# Patient Record
Sex: Female | Born: 1990 | Race: Black or African American | Hispanic: No | Marital: Single | Smoking: Former smoker
Health system: Southern US, Community
[De-identification: ages and names within clinical notes are randomized; demographics above are authoritative.]

## PROBLEM LIST (undated history)

## (undated) HISTORY — PX: APPENDECTOMY: SHX54

## (undated) HISTORY — PX: COLON SURGERY: SHX602

---

## 2013-01-25 ENCOUNTER — Emergency Department (HOSPITAL_COMMUNITY)
Admission: EM | Admit: 2013-01-25 | Discharge: 2013-01-25 | Disposition: A | Discharge status: Home / Self Care | Payer: Self-pay | Attending: Emergency Medicine | Admitting: Emergency Medicine

## 2013-01-25 ENCOUNTER — Encounter (HOSPITAL_COMMUNITY): Payer: Self-pay | Admitting: Emergency Medicine

## 2013-01-25 DIAGNOSIS — Z0489 Encounter for examination and observation for other specified reasons: Secondary | ICD-10-CM | POA: Insufficient documentation

## 2013-01-25 DIAGNOSIS — Z0283 Encounter for blood-alcohol and blood-drug test: Secondary | ICD-10-CM

## 2013-01-25 NOTE — ED Notes (Signed)
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  Pt admitted to drinking last night.  Employer brought her here to have a BAC done because the urgent care that [facility] is affiliated with does not do drug screens/ethanol.  Pt has consented to testing.

## 2013-01-25 NOTE — ED Provider Notes (Signed)
Medical screening examination/treatment/procedure(s) were performed by non-physician practitioner and as supervising physician I was immediately available for consultation/collaboration.  Lyanne Co, MD 01/25/13 [phone]

## 2013-01-25 NOTE — ED Provider Notes (Signed)
  CSN: [id number]     Arrival date & time 01/25/13  1014 History     First MD Initiated Contact with Patient 01/25/13 1019     Chief Complaint  Patient presents with  . BAC    (Consider location/radiation/quality/duration/timing/severity/associated sxs/prior Treatment) HPI Comments: Pt was brought in from her manager at [employer] because they smelled alcohol on her blood:pt is here for drug screen.pt admits to drinking although she can't remember how much:pt denies drug use  The history is provided by the patient. No language interpreter was used.    No past medical history on file. No past surgical history on file. No family history on file. History  Substance Use Topics  . Smoking status: Never Smoker   . Smokeless tobacco: Not on file  . Alcohol Use: No     Comment: not on a daily basis   OB History   Grav Para Term Preterm Abortions TAB SAB Ect Mult Living                 Review of Systems  Constitutional: Negative.   Respiratory: Negative.     Allergies  Review of patient's allergies indicates no known allergies.  Home Medications  No current outpatient prescriptions on file. BP 103/63  Pulse 82  Temp(Src) 98.3 F (36.8 C) (Oral)  Resp 20  SpO2 99%  LMP 01/11/2013 Physical Exam  Nursing note and vitals reviewed. Constitutional: She is oriented to person, place, and time. She appears well-developed and well-nourished.  Cardiovascular: Normal rate and regular rhythm.   Pulmonary/Chest: Effort normal and breath sounds normal.  Musculoskeletal: Normal range of motion.  Neurological: She is alert and oriented to person, place, and time.    ED Course   Procedures (including critical care time)  Labs Reviewed - No data to display No results found. 1. Encounter for drug screening     MDM  Pt drug screen sent  Teressa Lower, NP 01/25/13 1053

## 2013-07-31 ENCOUNTER — Ambulatory Visit (INDEPENDENT_AMBULATORY_CARE_PROVIDER_SITE_OTHER): Payer: BC Managed Care – PPO | Admitting: Advanced Practice Midwife

## 2013-07-31 ENCOUNTER — Encounter: Payer: Self-pay | Admitting: Advanced Practice Midwife

## 2013-07-31 VITALS — BP 110/69 | HR 69 | Temp 97.9°F | Ht 62.0 in | Wt 212.0 lb

## 2013-07-31 DIAGNOSIS — Z975 Presence of (intrauterine) contraceptive device: Secondary | ICD-10-CM | POA: Insufficient documentation

## 2013-07-31 DIAGNOSIS — R5383 Other fatigue: Secondary | ICD-10-CM

## 2013-07-31 DIAGNOSIS — Z01419 Encounter for gynecological examination (general) (routine) without abnormal findings: Secondary | ICD-10-CM

## 2013-07-31 DIAGNOSIS — Z833 Family history of diabetes mellitus: Secondary | ICD-10-CM | POA: Insufficient documentation

## 2013-07-31 DIAGNOSIS — IMO0002 Reserved for concepts with insufficient information to code with codable children: Secondary | ICD-10-CM | POA: Insufficient documentation

## 2013-07-31 LAB — HEMOGLOBIN A1C
HEMOGLOBIN A1C: 5 % (ref <5.7)
Mean Plasma Glucose: 97 mg/dL (ref <117)

## 2013-07-31 NOTE — Progress Notes (Signed)
  Subjective:     Robin Kaufman is a 23 y.o. female and is here for a comprehensive physical exam. The patient reports no problems.  Reports occasional long periods of vaginal bleeding on implanon. Has had implanon in for 1 year and had one previously. She is happy with the contraception and does not want anything currently for the abnormal bleeding. She would like to wait to see what happens.  History   Social History  . Marital Status: Single    Spouse Name: N/A    Number of Children: N/A  . Years of Education: N/A   Occupational History  . Not on file.   Social History Main Topics  . Smoking status: Never Smoker   . Smokeless tobacco: Never Used  . Alcohol Use: Yes     Comment: occasinally   . Drug Use: No  . Sexual Activity: Yes    Partners: Male    Birth Control/ Protection: Implant   Other Topics Concern  . Not on file   Social History Narrative  . No narrative on file   No health maintenance topics applied.  The following portions of the patient's history were reviewed and updated as appropriate: allergies, current medications, past family history, past medical history, past social history, past surgical history and problem list.  Review of Systems A comprehensive review of systems was negative.   Objective:    BP 110/69  Pulse 69  Temp(Src) 97.9 F (36.6 C)  Ht 5\' 2"  (1.575 m)  Wt 212 lb (96.163 kg)  BMI 38.77 kg/m2  LMP 07/24/2013 General appearance: alert and cooperative Head: Normocephalic, without obvious abnormality, atraumatic Eyes: conjunctivae/corneas clear. PERRL, EOM's intact. Fundi benign. Nose: Nares normal. Septum midline. Mucosa normal. No drainage or sinus tenderness. Throat: lips, mucosa, and tongue normal; teeth and gums normal Neck: no adenopathy, no carotid bruit, no JVD, supple, symmetrical, trachea midline and thyroid not enlarged, symmetric, no tenderness/mass/nodules Back: symmetric, no curvature. ROM normal. No CVA  tenderness. Lungs: clear to auscultation bilaterally Breasts: normal appearance, no masses or tenderness Heart: regular rate and rhythm, S1, S2 normal, no murmur, click, rub or gallop Abdomen: soft, non-tender; bowel sounds normal; no masses,  no organomegaly Pelvic: cervix normal in appearance, external genitalia normal, no adnexal masses or tenderness, no cervical motion tenderness, rectovaginal septum normal, uterus normal size, shape, and consistency and vagina normal without discharge Extremities: extremities normal, atraumatic, no cyanosis or edema Pulses: 2+ and symmetric Skin: Skin color, texture, turgor normal. No rashes or lesions Lymph nodes: Cervical, supraclavicular, and axillary nodes normal. Neurologic: Grossly normal    Assessment:    Healthy female exam.  Patient Active Problem List   Diagnosis Date Noted  . High BMI 07/31/2013  . Implanon in place 07/31/2013  . Family history of diabetes mellitus (DM) 07/31/2013      Plan:     See After Visit Summary for Counseling Recommendations  Encouraged weight loss and healthy diet. Patient to RTC if concerned or would like management for AUB on implanon. Labs today including HgbA1C, TSH, STD screening. Patient declines CBC and CMP. Pap w/ GC/CT.  40 min spent with patient greater than 80% spent in counseling and coordination of care.   Maree Ainley Wilson SingerWren CNM

## 2013-07-31 NOTE — Progress Notes (Signed)
Patient in office today for an annual exam. Patient denies any concerns. Patient states she is currently on her period but that it is very light.  Last Pap: 2007 Results: Normal.

## 2013-08-01 LAB — TSH: TSH: 1.924 u[IU]/mL (ref 0.350–4.500)

## 2013-08-01 LAB — RPR

## 2013-08-01 LAB — GC/CHLAMYDIA PROBE AMP
CT Probe RNA: NEGATIVE
GC Probe RNA: NEGATIVE

## 2013-08-01 LAB — HEPATITIS B SURFACE ANTIGEN: Hepatitis B Surface Ag: NEGATIVE

## 2013-08-01 LAB — HIV ANTIBODY (ROUTINE TESTING W REFLEX): HIV: NONREACTIVE

## 2013-08-03 LAB — PAP IG, CT-NG, RFX HPV ASCU
Chlamydia Probe Amp: NEGATIVE
GC PROBE AMP: NEGATIVE

## 2013-11-27 ENCOUNTER — Ambulatory Visit (INDEPENDENT_AMBULATORY_CARE_PROVIDER_SITE_OTHER): Payer: BC Managed Care – PPO | Admitting: Advanced Practice Midwife

## 2013-11-27 VITALS — BP 106/58 | HR 76 | Temp 98.1°F | Ht 62.0 in | Wt 219.0 lb

## 2013-11-27 DIAGNOSIS — Z113 Encounter for screening for infections with a predominantly sexual mode of transmission: Secondary | ICD-10-CM

## 2013-11-27 NOTE — Progress Notes (Signed)
Subjective:    Robin Kaufman is a 23 y.o. female who presents for sexually transmitted disease check. Sexual history reviewed with the patient. STI Exposure: sexual contact with individual with uncertain background 2 weeks ago. Current symptoms none. Contraception: Nexplanon Menstrual History: OB History   Grav Para Term Preterm Abortions TAB SAB Ect Mult Living   1 1 1       1       Menarche age: [age]  Patient's last menstrual period was 11/24/2013.    No past medical history on file.  Past Surgical History  Procedure Laterality Date  . Appendectomy    . Colon surgery      Partially removed    No current outpatient prescriptions on file.   No current facility-administered medications for this visit.   No Known Allergies  History  Substance Use Topics  . Smoking status: Never Smoker   . Smokeless tobacco: Never Used  . Alcohol Use: Yes     Comment: occasinally     Family History  Problem Relation Age of Onset  . Diabetes Mother   . Hypertension Mother       Review of Systems Constitutional: negative for fevers Gastrointestinal: negative for abdominal pain Genitourinary:negative for abnormal menstrual periods, genital lesions, sexual problems and vaginal discharge and dysuria    Objective:    BP 106/58  Pulse 76  Temp(Src) 98.1 F (36.7 C)  Ht 5\' 2"  (1.575 m)  Wt 219 lb (99.338 kg)  BMI 40.05 kg/m2  LMP 11/24/2013 General:   alert  Skin:   no rash or abnormalities           Abdomen:  normal findings: no organomegaly, soft, non-tender and no hernia  Pelvis:  External genitalia: normal general appearance Urinary system: urethral meatus normal and bladder without fullness, nontender Vaginal: normal without tenderness, induration or masses, vaginal odor present Cervix: normal appearance Adnexa: normal bimanual exam Uterus: anteverted and non-tender, normal size      Microscopic wet-mount exam shows hyphae, lactobacilli, +whiff.  Lab Review Urine  pregnancy test Labs reviewed yes Radiologic studies reviewed no   Assessment:    Possible STD exposure , asymptomatic   Plan:    Discussed safe sexual practice in detail Labs pending Follow up as needed. Suspect +BV   [place] Wilson SingerWren CNM

## 2013-11-28 LAB — GC/CHLAMYDIA PROBE AMP
CT Probe RNA: NEGATIVE
GC Probe RNA: NEGATIVE

## 2013-11-28 LAB — RPR

## 2013-11-28 LAB — WET PREP BY MOLECULAR PROBE
Candida species: NEGATIVE
Gardnerella vaginalis: POSITIVE — AB
Trichomonas vaginosis: NEGATIVE

## 2013-11-28 LAB — HIV ANTIBODY (ROUTINE TESTING W REFLEX): HIV 1&2 Ab, 4th Generation: NONREACTIVE

## 2013-11-28 LAB — HEPATITIS B SURFACE ANTIGEN: HEP B S AG: NEGATIVE

## 2013-11-28 LAB — HEPATITIS C ANTIBODY: HCV Ab: NEGATIVE

## 2013-12-01 ENCOUNTER — Ambulatory Visit: Payer: BC Managed Care – PPO | Admitting: Advanced Practice Midwife

## 2013-12-01 ENCOUNTER — Other Ambulatory Visit: Payer: Self-pay | Admitting: *Deleted

## 2013-12-01 DIAGNOSIS — B9689 Other specified bacterial agents as the cause of diseases classified elsewhere: Secondary | ICD-10-CM

## 2013-12-01 DIAGNOSIS — N760 Acute vaginitis: Principal | ICD-10-CM

## 2013-12-01 MED ORDER — METRONIDAZOLE 500 MG PO TABS: 500.0000 mg | ORAL_TABLET | Freq: Two times a day (BID) | ORAL | Status: DC

## 2014-04-19 ENCOUNTER — Encounter: Payer: Self-pay | Admitting: Advanced Practice Midwife

## 2014-09-14 ENCOUNTER — Telehealth: Payer: Self-pay | Admitting: Obstetrics

## 2014-09-15 NOTE — Telephone Encounter (Signed)
[phone] - Patient aware and calling Medicaid. brm

## 2016-04-24 ENCOUNTER — Ambulatory Visit: Payer: Self-pay | Admitting: Obstetrics and Gynecology

## 2016-07-26 ENCOUNTER — Ambulatory Visit: Payer: Self-pay | Admitting: Obstetrics and Gynecology

## 2016-08-01 ENCOUNTER — Ambulatory Visit (INDEPENDENT_AMBULATORY_CARE_PROVIDER_SITE_OTHER): Payer: Medicaid Other | Admitting: Obstetrics and Gynecology

## 2016-08-01 ENCOUNTER — Encounter: Payer: Self-pay | Admitting: Obstetrics and Gynecology

## 2016-08-01 ENCOUNTER — Other Ambulatory Visit (HOSPITAL_COMMUNITY)
Admission: RE | Admit: 2016-08-01 | Discharge: 2016-08-01 | Disposition: A | Discharge status: Home / Self Care | Payer: Medicaid Other | Source: Ambulatory Visit | Attending: Obstetrics and Gynecology | Admitting: Obstetrics and Gynecology

## 2016-08-01 VITALS — BP 108/70 | HR 73 | Temp 98.0°F | Wt 222.2 lb

## 2016-08-01 DIAGNOSIS — Z01419 Encounter for gynecological examination (general) (routine) without abnormal findings: Secondary | ICD-10-CM | POA: Diagnosis not present

## 2016-08-01 DIAGNOSIS — Z113 Encounter for screening for infections with a predominantly sexual mode of transmission: Secondary | ICD-10-CM | POA: Diagnosis present

## 2016-08-01 DIAGNOSIS — Z202 Contact with and (suspected) exposure to infections with a predominantly sexual mode of transmission: Secondary | ICD-10-CM | POA: Insufficient documentation

## 2016-08-01 DIAGNOSIS — Z975 Presence of (intrauterine) contraceptive device: Secondary | ICD-10-CM

## 2016-08-01 DIAGNOSIS — Z0000 Encounter for general adult medical examination without abnormal findings: Secondary | ICD-10-CM

## 2016-08-01 NOTE — Progress Notes (Signed)
Subjective:     Robin Kaufman is a 26 y.o. female here for a routine exam. She has no GYN complaints. She desires STD testing. Has Implanon for contraception. Placed by [facility]. Has last information card, however.    Gynecologic History Patient's last menstrual period was 06/20/2016 (exact date). Contraception: Nexplanon Last Pap: 2016. Results were: normal Last mammogram: NA. Results were: NA  Obstetric History OB History  Gravida Para Term Preterm AB Living  1 1 1     1   SAB TAB Ectopic Multiple Live Births          1    # Outcome Date GA Lbr Len/2nd Weight Sex Delivery Anes PTL Lv  1 Term 12/29/06 [id number]  6 lb 4.1 oz (2.838 kg) M Vag-Spont EPI  LIV       The following portions of the patient's history were reviewed and updated as appropriate: allergies, current medications, past family history, past medical history, past social history and past surgical history.  Review of Systems Pertinent items are noted in HPI.    Objective:    BP 108/70   Pulse 73   Temp 98 F (36.7 C)   Wt 222 lb 3.2 oz (100.8 kg)   LMP 06/20/2016 (Exact Date)   BMI 40.64 kg/m   General Appearance:    Alert, cooperative, no distress, appears stated age  Head:    Normocephalic, without obvious abnormality, atraumatic  Eyes:    PERRL, conjunctiva/corneas clear, EOM's intact, fundi    benign, both eyes  Ears:    Normal TM's and external ear canals, both ears  Nose:   Nares normal, septum midline, mucosa normal, no drainage    or sinus tenderness  Throat:   Lips, mucosa, and tongue normal; teeth and gums normal  Neck:   Supple, symmetrical, trachea midline, no adenopathy;    thyroid:  no enlargement/tenderness/nodules; no carotid   bruit or JVD  Back:     Symmetric, no curvature, ROM normal, no CVA tenderness  Lungs:     Clear to auscultation bilaterally, respirations unlabored  Chest Wall:    No tenderness or deformity   Heart:    Regular rate and rhythm, S1 and S2 normal, no  murmur, rub   or gallop  Breast Exam:    No tenderness, masses, or nipple abnormality  Abdomen:     Soft, non-tender, bowel sounds active all four quadrants,    no masses, no organomegaly  Genitalia:    Normal female without lesion, discharge or tenderness  Rectal:    Normal tone, normal prostate, no masses or tenderness;   guaiac negative stool  Extremities:   Extremities normal, atraumatic, no cyanosis or edema  Pulses:   2+ and symmetric all extremities  Skin:   Skin color, texture, turgor normal, no rashes or lesions  Lymph nodes:   Cervical, supraclavicular, and axillary nodes normal  Neurologic:   CNII-XII intact, normal strength, sensation and reflexes    throughout      Assessment:    Healthy female exam.   STD exposure Nexplanon Plan:    Pap smear and STD testing completed today. Pt advised to [facility] to obtain a new information card on her Nexplanon. F/U in 1 yr or PRN

## 2016-08-02 LAB — HIV ANTIBODY (ROUTINE TESTING W REFLEX): HIV SCREEN 4TH GENERATION: NONREACTIVE

## 2016-08-02 LAB — HEPATITIS B SURFACE ANTIGEN: Hepatitis B Surface Ag: NEGATIVE

## 2016-08-02 LAB — RPR: RPR Ser Ql: NONREACTIVE

## 2016-08-03 LAB — CERVICOVAGINAL ANCILLARY ONLY
BACTERIAL VAGINITIS: POSITIVE — AB
Candida vaginitis: NEGATIVE
Chlamydia: NEGATIVE
Neisseria Gonorrhea: NEGATIVE
Trichomonas: NEGATIVE

## 2016-08-03 LAB — CYTOLOGY - PAP
Adequacy: ABSENT
DIAGNOSIS: NEGATIVE

## 2016-08-13 ENCOUNTER — Encounter: Payer: Self-pay | Admitting: *Deleted

## 2016-08-13 ENCOUNTER — Other Ambulatory Visit: Payer: Self-pay | Admitting: *Deleted

## 2016-08-13 DIAGNOSIS — N760 Acute vaginitis: Principal | ICD-10-CM

## 2016-08-13 DIAGNOSIS — B9689 Other specified bacterial agents as the cause of diseases classified elsewhere: Secondary | ICD-10-CM

## 2016-08-13 MED ORDER — METRONIDAZOLE 500 MG PO TABS: 500.0000 mg | ORAL_TABLET | Freq: Two times a day (BID) | ORAL | 0 refills | Status: DC

## 2016-08-13 NOTE — Progress Notes (Unsigned)
flagyl

## 2016-12-05 ENCOUNTER — Encounter: Payer: Self-pay | Admitting: Obstetrics and Gynecology

## 2016-12-05 ENCOUNTER — Encounter: Payer: Self-pay | Admitting: *Deleted

## 2016-12-05 ENCOUNTER — Other Ambulatory Visit (HOSPITAL_COMMUNITY)
Admission: RE | Admit: 2016-12-05 | Discharge: 2016-12-05 | Disposition: A | Discharge status: Home / Self Care | Payer: Medicaid Other | Source: Ambulatory Visit | Attending: Obstetrics and Gynecology | Admitting: Obstetrics and Gynecology

## 2016-12-05 ENCOUNTER — Ambulatory Visit (INDEPENDENT_AMBULATORY_CARE_PROVIDER_SITE_OTHER): Payer: Medicaid Other | Admitting: Obstetrics and Gynecology

## 2016-12-05 VITALS — BP 102/67 | HR 68 | Ht 62.0 in | Wt 219.0 lb

## 2016-12-05 DIAGNOSIS — Z3046 Encounter for surveillance of implantable subdermal contraceptive: Secondary | ICD-10-CM | POA: Insufficient documentation

## 2016-12-05 DIAGNOSIS — Z113 Encounter for screening for infections with a predominantly sexual mode of transmission: Secondary | ICD-10-CM | POA: Diagnosis present

## 2016-12-05 NOTE — Progress Notes (Signed)
Robin Kaufman is a 26 y.o. G1P1001 here for Nexplanon removal and desires STD testing.    No other gynecologic concerns.   Nexplanon Removal Patient identified, informed consent performed, consent signed.   Appropriate time out taken. Nexplanon site identified.  Area prepped in usual sterile fashon. One ml of 1% lidocaine was used to anesthetize the area at the distal end of the implant. A small stab incision was made right beside the implant on the distal portion.  The Nexplanon rod was grasped using hemostats and removed without difficulty.  There was minimal blood loss. There were no complications.     Steri-strips were applied over the small incision.  A pressure bandage was applied to reduce any bruising.  The patient tolerated the procedure well and was given post procedure instructions.  Patient is planning to use condoms for contraception/attempt conception.   Hermina StaggersMichael L Kanya Potteiger, MD, FACOG Attending Obstetrician & Gynecologist [facility]

## 2016-12-05 NOTE — Patient Instructions (Signed)
Contraceptive Implant Information A contraceptive implant is a small, plastic rod that is inserted under the skin. The implant releases a hormone into the bloodstream that prevents pregnancy. Contraceptive implants can be effective for up to 3 years. They do not provide protection against STIs (sexually transmitted infections). How does the implant work? Contraceptive implants prevent pregnancy by releasing a small amount of progestin into the bloodstream. Progestin has similar effects to the hormone progesterone, which plays a role in menstrual periods and pregnancy. Progestin will:  Stop the ovaries from releasing eggs.  Thicken cervical mucus to prevent sperm from entering the cervix.  Thin out the lining of the uterus to prevent a fertilized egg from attaching to the wall of the uterus.  What are the advantages of this form of birth control? The advantages of this form of birth control include the following:  It is very effective at preventing pregnancy.  It is effective for up to 3 years.  It can easily be removed.  It does not interfere with sex or daily activities.  It can be used when breastfeeding.  It can be used by women who cannot take estrogen.  The procedure to insert the device is quick.  Women can get pregnant shortly after removing the device.  What are the disadvantages of this form of birth control? The disadvantages of this form of birth control include the following:  It can cause side effects, including: ? Irregular menstrual periods or bleeding. ? Headache. ? Weight gain. ? Acne. ? Breast tenderness. ? Abdomen (abdominal) pain. ? Mood changes, such as depression.  It does not protect against STIs.  You must make an office visit to have it inserted and removed by a trained clinician.  Inserting or removing the device can result in pain, scarring, and tissue or nerve damage (rare).  How is this implant inserted? The procedure to insert an implant  only takes a few minutes. During the procedure:  Your upper arm will be numbed with a numbing medicine (local anesthetic).  The implant will be injected under the skin of your upper arm with a needle.  After the procedure:  You may experience minor bruising, swelling, or discomfort at the insertion site. This should only last for a couple of days.  You may need to use another, non-hormonal contraceptive such as a condom for 7 days after the procedure.  How is the implant removed? The implant should be removed after 3 years or as directed by your health care provider. The procedure to remove the implant only takes a few minutes. During this procedure:  Your upper arm will be numbed with a local anesthetic.  A small incision will be made near the implant.  The implant will be removed with a small pair of forceps.  After the implant is removed:  The effect of the implant will wear off a few hours after removal. Most women will be able to get pregnant within 3 weeks of removal.  A new implant can be inserted as soon as the old one is removed, if desired.  You may experience minor bruising, swelling, or discomfort at the removal site. This should only last for a couple of days.  Is this implant right for me? Your health care provider can help you determine whether you are good candidate for a contraceptive implant. Make sure to discuss the possible side effects with your health care provider. You should not get the implant if you:  Are pregnant.  Are allergic   to any part of the implant.  Have a history of: ? Breast cancer. ? Unusual bleeding from the vagina. ? Heart disease. ? Stroke. ? Liver disease or tumors. ? Migraines.  Summary  A contraceptive implant is a small, plastic rod that is inserted under the skin. The implant releases a hormone into the bloodstream that prevents pregnancy.  Contraceptive implants can be effective for up to 3 years.  The implant works by  preventing ovaries from releasing eggs, thickening the cervical mucus, and thinning the uterine wall.  This form of birth control is very effective at preventing pregnancy and can be inserted and removed quickly. Women can get pregnant shortly after the device is removed.  This form of birth control can cause some side effects, including weight gain, breast tenderness, headaches, irregular periods or bleeding, acne, abdominal pain, and depression. It does not provide protection against STIs (sexually transmitted infections). This information is not intended to replace advice given to you by your health care provider. Make sure you discuss any questions you have with your health care provider. Document Released: 05/24/2011 Document Revised: 05/19/2016 Document Reviewed: 05/19/2016 Elsevier Interactive Patient Education  2017 Elsevier Inc.  

## 2016-12-05 NOTE — Addendum Note (Signed)
Addended by: Hamilton CapriBURCH, Roderica Cathell J on: 12/05/2016 11:34 AM   Modules accepted: Orders

## 2016-12-06 LAB — URINE CYTOLOGY ANCILLARY ONLY
CHLAMYDIA, DNA PROBE: NEGATIVE
Neisseria Gonorrhea: NEGATIVE
TRICH (WINDOWPATH): NEGATIVE

## 2016-12-07 LAB — RPR+HSVIGM+HBSAG+HSV2(IGG)+...
HIV Screen 4th Generation wRfx: NONREACTIVE
HSV 2 Glycoprotein G Ab, IgG: <0.91 index (ref 0.00–0.90)
HSVI/II Comb IgM: <0.91 Ratio (ref 0.00–0.90)
Hepatitis B Surface Ag: NEGATIVE
RPR Ser Ql: NONREACTIVE

## 2016-12-10 LAB — URINE CYTOLOGY ANCILLARY ONLY
Bacterial vaginitis: NEGATIVE
Candida vaginitis: NEGATIVE

## 2017-02-11 ENCOUNTER — Ambulatory Visit: Payer: Medicaid Other | Admitting: Advanced Practice Midwife

## 2017-06-13 ENCOUNTER — Other Ambulatory Visit: Payer: Self-pay

## 2017-06-13 ENCOUNTER — Encounter: Payer: Self-pay | Admitting: Obstetrics & Gynecology

## 2017-06-13 ENCOUNTER — Ambulatory Visit: Payer: Medicaid Other | Admitting: Obstetrics & Gynecology

## 2017-06-13 ENCOUNTER — Other Ambulatory Visit (HOSPITAL_COMMUNITY)
Admission: RE | Admit: 2017-06-13 | Discharge: 2017-06-13 | Disposition: A | Discharge status: Home / Self Care | Payer: Medicaid Other | Source: Ambulatory Visit | Attending: Obstetrics & Gynecology | Admitting: Obstetrics & Gynecology

## 2017-06-13 VITALS — BP 117/78 | HR 88 | Ht 63.0 in | Wt 234.0 lb

## 2017-06-13 DIAGNOSIS — Z202 Contact with and (suspected) exposure to infections with a predominantly sexual mode of transmission: Secondary | ICD-10-CM

## 2017-06-13 NOTE — Progress Notes (Signed)
Presents for STD Testing. [facility]. She received a pamphlet with contraceptive information and her STD testing will be done from her urine specimen and blood as she has no symptoms today. 10 min face to face and coordination of care, review of lab results 07/2016 and 11/2016  Robin PhenixArnold, Robin Abair G, MD 06/13/2017

## 2017-06-13 NOTE — Patient Instructions (Signed)
Sexually Transmitted Disease  A sexually transmitted disease (STD) is a disease or infection that may be passed (transmitted) from person to person, usually during sexual activity. This may happen by way of saliva, semen, blood, vaginal mucus, or urine. Common STDs include:   Gonorrhea.   Chlamydia.   Syphilis.   HIV and AIDS.   Genital herpes.   Hepatitis B and C.   Trichomonas.   Human papillomavirus (HPV).   Pubic lice.   Scabies.   Mites.   Bacterial vaginosis.    What are the causes?  An STD may be caused by bacteria, a virus, or parasites. STDs are often transmitted during sexual activity if one person is infected. However, they may also be transmitted through nonsexual means. STDs may be transmitted after:   Sexual intercourse with an infected person.   Sharing sex toys with an infected person.   Sharing needles with an infected person or using unclean piercing or tattoo needles.   Having intimate contact with the genitals, mouth, or rectal areas of an infected person.   Exposure to infected fluids during birth.    What are the signs or symptoms?  Different STDs have different symptoms. Some people may not have any symptoms. If symptoms are present, they may include:   Painful or bloody urination.   Pain in the pelvis, abdomen, vagina, anus, throat, or eyes.   A skin rash, itching, or irritation.   Growths, ulcerations, blisters, or sores in the genital and anal areas.   Abnormal vaginal discharge with or without bad odor.   Penile discharge in men.   Fever.   Pain or bleeding during sexual intercourse.   Swollen glands in the groin area.   Yellow skin and eyes (jaundice). This is seen with hepatitis.   Swollen testicles.   Infertility.   Sores and blisters in the mouth.    How is this diagnosed?  To make a diagnosis, your health care provider may:   Take a medical history.   Perform a physical exam.   Take a sample of any discharge to examine.   Swab the throat, cervix,  opening to the penis, rectum, or vagina for testing.   Test a sample of your first morning urine.   Perform blood tests.   Perform a Pap test, if this applies.   Perform a colposcopy.   Perform a laparoscopy.    How is this treated?  Treatment depends on the STD. Some STDs may be treated but not cured.   Chlamydia, gonorrhea, trichomonas, and syphilis can be cured with antibiotic medicine.   Genital herpes, hepatitis, and HIV can be treated, but not cured, with prescribed medicines. The medicines lessen symptoms.   Genital warts from HPV can be treated with medicine or by freezing, burning (electrocautery), or surgery. Warts may come back.   HPV cannot be cured with medicine or surgery. However, abnormal areas may be removed from the cervix, vagina, or vulva.   If your diagnosis is confirmed, your recent sexual partners need treatment. This is true even if they are symptom-free or have a negative culture or evaluation. They should not have sex until their health care providers say it is okay.   Your health care provider may test you for infection again 3 months after treatment.    How is this prevented?  Take these steps to reduce your risk of getting an STD:   Use latex condoms, dental dams, and water-soluble lubricants during sexual activity. Do not use   petroleum jelly or oils.   Avoid having multiple sex partners.   Do not have sex with someone who has other sex partners.   Do not have sex with anyone you do not know or who is at high risk for an STD.   Avoid risky sex practices that can break your skin.   Do not have sex if you have open sores on your mouth or skin.   Avoid drinking too much alcohol or taking illegal drugs. Alcohol and drugs can affect your judgment and put you in a vulnerable position.   Avoid engaging in oral and anal sex acts.   Get vaccinated for HPV and hepatitis. If you have not received these vaccines in the past, talk to your health care provider about whether one or  both might be right for you.   If you are at risk of being infected with HIV, it is recommended that you take a prescription medicine daily to prevent HIV infection. This is called pre-exposure prophylaxis (PrEP). You are considered at risk if:  ? You are a man who has sex with other men (MSM).  ? You are a heterosexual man or woman and are sexually active with more than one partner.  ? You take drugs by injection.  ? You are sexually active with a partner who has HIV.   Talk with your health care provider about whether you are at high risk of being infected with HIV. If you choose to begin PrEP, you should first be tested for HIV. You should then be tested every 3 months for as long as you are taking PrEP.    Contact a health care provider if:   See your health care provider.   Tell your sexual partner(s). They should be tested and treated for any STDs.   Do not have sex until your health care provider says it is okay.  Get help right away if:  Contact your health care provider right away if:   You have severe abdominal pain.   You are a man and notice swelling or pain in your testicles.   You are a woman and notice swelling or pain in your vagina.    This information is not intended to replace advice given to you by your health care provider. Make sure you discuss any questions you have with your health care provider.  Document Released: 08/25/2002 Document Revised: 12/23/2015 Document Reviewed: 12/23/2012  Elsevier Interactive Patient Education  2018 Elsevier Inc.

## 2017-06-14 LAB — HEPATITIS B CORE ANTIBODY, TOTAL: HEP B C TOTAL AB: NEGATIVE

## 2017-06-14 LAB — HEPATITIS C ANTIBODY

## 2017-06-14 LAB — HIV ANTIBODY (ROUTINE TESTING W REFLEX): HIV SCREEN 4TH GENERATION: NONREACTIVE

## 2017-06-14 LAB — RPR: RPR Ser Ql: NONREACTIVE

## 2017-06-17 LAB — CERVICOVAGINAL ANCILLARY ONLY
Chlamydia: NEGATIVE
Neisseria Gonorrhea: NEGATIVE
Trichomonas: NEGATIVE

## 2017-06-20 LAB — URINE CYTOLOGY ANCILLARY ONLY
Bacterial vaginitis: NEGATIVE
Candida vaginitis: NEGATIVE

## 2019-01-19 ENCOUNTER — Encounter: Payer: Self-pay | Admitting: Advanced Practice Midwife

## 2019-01-19 ENCOUNTER — Ambulatory Visit (INDEPENDENT_AMBULATORY_CARE_PROVIDER_SITE_OTHER): Payer: Medicaid Other | Admitting: Advanced Practice Midwife

## 2019-01-19 ENCOUNTER — Other Ambulatory Visit: Payer: Self-pay

## 2019-01-19 ENCOUNTER — Other Ambulatory Visit (HOSPITAL_COMMUNITY)
Admission: RE | Admit: 2019-01-19 | Discharge: 2019-01-19 | Disposition: A | Discharge status: Home / Self Care | Payer: Medicaid Other | Source: Ambulatory Visit | Attending: Advanced Practice Midwife | Admitting: Advanced Practice Midwife

## 2019-01-19 VITALS — BP 112/77 | HR 60

## 2019-01-19 DIAGNOSIS — Z113 Encounter for screening for infections with a predominantly sexual mode of transmission: Secondary | ICD-10-CM

## 2019-01-19 DIAGNOSIS — Z0000 Encounter for general adult medical examination without abnormal findings: Secondary | ICD-10-CM | POA: Diagnosis not present

## 2019-01-19 DIAGNOSIS — Z3009 Encounter for other general counseling and advice on contraception: Secondary | ICD-10-CM

## 2019-01-19 NOTE — Patient Instructions (Signed)
Etonogestrel implant What is this medicine? ETONOGESTREL (et oh noe JES trel) is a contraceptive (birth control) device. It is used to prevent pregnancy. It can be used for up to 3 years. This medicine may be used for other purposes; ask your health care provider or pharmacist if you have questions. COMMON BRAND NAME(S): Implanon, Nexplanon What should I tell my health care provider before I take this medicine? They need to know if you have any of these conditions:  abnormal vaginal bleeding  blood vessel disease or blood clots  breast, cervical, endometrial, ovarian, liver, or uterine cancer  diabetes  gallbladder disease  heart disease or recent heart attack  high blood pressure  high cholesterol or triglycerides  kidney disease  liver disease  migraine headaches  seizures  stroke  tobacco smoker  an unusual or allergic reaction to etonogestrel, anesthetics or antiseptics, other medicines, foods, dyes, or preservatives  pregnant or trying to get pregnant  breast-feeding How should I use this medicine? This device is inserted just under the skin on the inner side of your upper arm by a health care professional. Talk to your pediatrician regarding the use of this medicine in children. Special care may be needed. Overdosage: If you think you have taken too much of this medicine contact a poison control center or emergency room at once. NOTE: This medicine is only for you. Do not share this medicine with others. What if I miss a dose? This does not apply. What may interact with this medicine? Do not take this medicine with any of the following medications:  amprenavir  fosamprenavir This medicine may also interact with the following medications:  acitretin  aprepitant  armodafinil  bexarotene  bosentan  carbamazepine  certain medicines for fungal infections like fluconazole, ketoconazole, itraconazole and voriconazole  certain medicines to treat  hepatitis, HIV or AIDS  cyclosporine  felbamate  griseofulvin  lamotrigine  modafinil  oxcarbazepine  phenobarbital  phenytoin  primidone  rifabutin  rifampin  rifapentine  St. John's wort  topiramate This list may not describe all possible interactions. Give your health care provider a list of all the medicines, herbs, non-prescription drugs, or dietary supplements you use. Also tell them if you smoke, drink alcohol, or use illegal drugs. Some items may interact with your medicine. What should I watch for while using this medicine? This product does not protect you against HIV infection (AIDS) or other sexually transmitted diseases. You should be able to feel the implant by pressing your fingertips over the skin where it was inserted. Contact your doctor if you cannot feel the implant, and use a non-hormonal birth control method (such as condoms) until your doctor confirms that the implant is in place. Contact your doctor if you think that the implant may have broken or become bent while in your arm. You will receive a user card from your health care provider after the implant is inserted. The card is a record of the location of the implant in your upper arm and when it should be removed. Keep this card with your health records. What side effects may I notice from receiving this medicine? Side effects that you should report to your doctor or health care professional as soon as possible:  allergic reactions like skin rash, itching or hives, swelling of the face, lips, or tongue  breast lumps, breast tissue changes, or discharge  breathing problems  changes in emotions or moods  if you feel that the implant may have broken or   bent while in your arm  high blood pressure  pain, irritation, swelling, or bruising at the insertion site  scar at site of insertion  signs of infection at the insertion site such as fever, and skin redness, pain or discharge  signs and  symptoms of a blood clot such as breathing problems; changes in vision; chest pain; severe, sudden headache; pain, swelling, warmth in the leg; trouble speaking; sudden numbness or weakness of the face, arm or leg  signs and symptoms of liver injury like dark yellow or brown urine; general ill feeling or flu-like symptoms; light-colored stools; loss of appetite; nausea; right upper belly pain; unusually weak or tired; yellowing of the eyes or skin  unusual vaginal bleeding, discharge Side effects that usually do not require medical attention (report to your doctor or health care professional if they continue or are bothersome):  acne  breast pain or tenderness  headache  irregular menstrual bleeding  nausea This list may not describe all possible side effects. Call your doctor for medical advice about side effects. You may report side effects to FDA at 1-800-FDA-1088. Where should I keep my medicine? This drug is given in a hospital or clinic and will not be stored at home. NOTE: This sheet is a summary. It may not cover all possible information. If you have questions about this medicine, talk to your doctor, pharmacist, or health care provider.  2020 Elsevier/Gold Standard (2017-04-23 14:11:42)  

## 2019-01-19 NOTE — Progress Notes (Signed)
Last pap 2018- normal  

## 2019-01-19 NOTE — Progress Notes (Signed)
Subjective:     Robin Kaufman is a 28 y.o. female here for a routine exam.  Current complaints: None.  Reports irregular menses since her last Nexplanon was removed 12/05/2016 but they are light and she does not have any other concerns.  Personal health questionnaire reviewed: yes.  Pt feels safe at home, denies any violence.      Gynecologic History Patient's last menstrual period was 01/09/2019. Contraception: none Last Pap: 2018. Results were: normal Last mammogram: n/a  Obstetric History OB History  Gravida Para Term Preterm AB Living  1 1 1     1   SAB TAB Ectopic Multiple Live Births          1    # Outcome Date GA Lbr Len/2nd Weight Sex Delivery Anes PTL Lv  1 Term 12/29/06 [redacted]w[redacted]d  6 lb 4.1 oz (2.838 kg) M Vag-Spont EPI  LIV     The following portions of the patient's history were reviewed and updated as appropriate: allergies, current medications, past family history, past medical history, past social history, past surgical history and problem list.  Review of Systems Pertinent items noted in HPI and remainder of comprehensive ROS otherwise negative.    Objective:   BP 112/77   Pulse 60   LMP 01/09/2019  VS reviewed, nursing note reviewed,  Constitutional: well developed, well nourished, no distress HEENT: normocephalic CV: normal rate Pulm/chest wall: normal effort Breast Exam:  right breast normal without mass, skin or nipple changes or axillary nodes, left breast normal without mass, skin or nipple changes or axillary nodes Abdomen: soft Neuro: alert and oriented x 3 Skin: warm, dry Psych: affect normal Pelvic exam: STD/infection testing collected by blind vaginal swab, external anatomy wnl      Assessment/Plan:   1. Screen for STD (sexually transmitted disease)  - HIV Antibody (routine testing w rflx) - RPR - Hepatitis C antibody - Hepatitis B surface antigen - Cervicovaginal ancillary only( [facility])  2. Well woman exam (no gynecological  exam) --Doing well, menses irregular but light, not bothersome to pt.  No pain or other problems. --Pap normal in 2018, needs Pap next year per guidelines.  Offered Pap today, then 3 years until next Pap (if normal) but pt prefers to wait until it is due.  3. Encounter for counseling regarding contraception --Discussed LARCs as most effective forms of birth control.  Discussed benefits/risks of other methods.  Pt desires Nexplanon. She reports unprotected intercourse 2 days ago.  Will reschedule for Nexplanon insertion in 2 weeks.  Pt agrees with plan of care and will use condoms with intercourse until her appt.      Contraception: none  Follow up in: 2 weeks or as needed.   Fatima Blank, CNM 3:40 PM

## 2019-01-20 LAB — HEPATITIS B SURFACE ANTIGEN: Hepatitis B Surface Ag: NEGATIVE

## 2019-01-20 LAB — HIV ANTIBODY (ROUTINE TESTING W REFLEX): HIV Screen 4th Generation wRfx: NONREACTIVE

## 2019-01-20 LAB — RPR: RPR Ser Ql: NONREACTIVE

## 2019-01-20 LAB — HEPATITIS C ANTIBODY: Hep C Virus Ab: <0.1 s/co ratio (ref 0.0–0.9)

## 2019-01-23 LAB — CERVICOVAGINAL ANCILLARY ONLY
Candida vaginitis: NEGATIVE
Chlamydia: NEGATIVE
Neisseria Gonorrhea: NEGATIVE
Trichomonas: NEGATIVE

## 2019-01-24 MED ORDER — METRONIDAZOLE 500 MG PO TABS: 500.0000 mg | ORAL_TABLET | Freq: Two times a day (BID) | ORAL | 0 refills | Status: AC

## 2019-01-24 NOTE — Addendum Note (Signed)
Addended by: Fatima Blank A on: 01/24/2019 06:07 AM   Modules accepted: Orders

## 2019-02-10 ENCOUNTER — Ambulatory Visit: Payer: Medicaid Other | Admitting: Obstetrics & Gynecology

## 2019-02-13 ENCOUNTER — Encounter

## 2019-02-16 ENCOUNTER — Ambulatory Visit: Payer: Medicaid Other | Admitting: Advanced Practice Midwife

## 2019-02-16 ENCOUNTER — Other Ambulatory Visit: Payer: Self-pay

## 2019-02-16 ENCOUNTER — Other Ambulatory Visit (HOSPITAL_COMMUNITY)
Admission: RE | Admit: 2019-02-16 | Discharge: 2019-02-16 | Disposition: A | Discharge status: Home / Self Care | Payer: Medicaid Other | Source: Ambulatory Visit | Attending: Obstetrics & Gynecology | Admitting: Obstetrics & Gynecology

## 2019-02-16 ENCOUNTER — Encounter: Payer: Self-pay | Admitting: Advanced Practice Midwife

## 2019-02-16 VITALS — Wt 217.0 lb

## 2019-02-16 DIAGNOSIS — B373 Candidiasis of vulva and vagina: Secondary | ICD-10-CM

## 2019-02-16 DIAGNOSIS — N898 Other specified noninflammatory disorders of vagina: Secondary | ICD-10-CM | POA: Diagnosis present

## 2019-02-16 DIAGNOSIS — Z3009 Encounter for other general counseling and advice on contraception: Secondary | ICD-10-CM | POA: Diagnosis not present

## 2019-02-16 DIAGNOSIS — B3731 Acute candidiasis of vulva and vagina: Secondary | ICD-10-CM

## 2019-02-16 LAB — POCT URINE PREGNANCY: Preg Test, Ur: NEGATIVE

## 2019-02-16 MED ORDER — FLUCONAZOLE 150 MG PO TABS: ORAL_TABLET | ORAL | 0 refills | Status: DC

## 2019-02-16 NOTE — Progress Notes (Signed)
RGYN patient presents for Nexplanon Insertion pt is unsure if she wants contraception at this time. wants to discuss w/ provider.    Unable to leave urine sample at ths time pt given water and advised will need sample.   UPT Today is NEGATIVE  Pt denies any unprotected intercourse x 14 days.   CC: vaginal itching pt denies any discharge.no odor.  Pt unsure if antibiotics caused sx's.

## 2019-02-16 NOTE — Progress Notes (Signed)
  GYNECOLOGY PROGRESS NOTE  History:  28 y.o. G1P1001 presents to [facility] office today for possible Nexplanon insertion and problem gyn visit. She has not had unprotected intercourse within the last 2 weeks. Today, pt is hesitant about Nexplanon insertion. Per pt, she has no reason as to why she feels unsure about it. Had it in the past without problems. She is adamant that she does not want to get pregnant. She is also complaining of vaginal itching for 1 week. She reports she recently finished antibiotic for BV. She denies vaginal discharge, bleeding, abdominal/pelvic pain, h/a, dizziness, shortness of breath, n/v, or fever/chills.    The following portions of the patient's history were reviewed and updated as appropriate: allergies, current medications, past family history, past medical history, past social history, past surgical history and problem list. Last pap smear on 08/01/16 was normal, negative HRHPV.  Review of Systems:  Pertinent items are noted in HPI.   Objective:  Physical Exam Weight 98.4 kg, last menstrual period 01/07/2019. VS reviewed, nursing note reviewed,  Constitutional: well developed, well nourished, no distress HEENT: normocephalic CV: normal rate Pulm/chest wall: normal effort Breast Exam: deferred Abdomen: soft Neuro: alert and oriented x 3 Skin: warm, dry Psych: affect normal Pelvic exam: External genitalia red and appears irritated. Speculum exam deferred. Blind swab collected Bimanual exam: Deferred  Assessment & Plan:  1. Vaginal itching -Vaginal itching x1 week -Wet prep collected  - Cervicovaginal ancillary only( [facility])   2. Vaginal candidiasis -Vaginal itching x1 week -Perineum red and appears irritated -Will Rx Diflucan   - fluconazole (DIFLUCAN) 150 MG tablet; Take one tablet now, and a second tablet in 3 days  Dispense: 2 tablet; Refill: 0  3. Encounter for general counseling and advice on contraceptive management -Pt hesitant about  Nexplanon insertion and does not wish to proceed with procedure today -UPT negative  -Counseled patient on all birth control options, including LARCs, COCs, patches, rings, and natural family planning. -Encouraged the use of condoms to prevent pregnancy and STDs.   - POCT urine pregnancy  Pt to follow up as needed   Maryagnes Amos, SNM 9:24 AM

## 2019-02-16 NOTE — Patient Instructions (Addendum)
Contraception Choices Contraception, also called birth control, refers to methods or devices that prevent pregnancy. Hormonal methods Contraceptive implant  A contraceptive implant is a thin, plastic tube that contains a hormone. It is inserted into the upper part of the arm. It can remain in place for up to 3 years. Progestin-only injections Progestin-only injections are injections of progestin, a synthetic form of the hormone progesterone. They are given every 3 months by a health care provider. Birth control pills  Birth control pills are pills that contain hormones that prevent pregnancy. They must be taken once a day, preferably at the same time each day. Birth control patch  The birth control patch contains hormones that prevent pregnancy. It is placed on the skin and must be changed once a week for three weeks and removed on the fourth week. A prescription is needed to use this method of contraception. Vaginal ring  A vaginal ring contains hormones that prevent pregnancy. It is placed in the vagina for three weeks and removed on the fourth week. After that, the process is repeated with a new ring. A prescription is needed to use this method of contraception. Emergency contraceptive Emergency contraceptives prevent pregnancy after unprotected sex. They come in pill form and can be taken up to 5 days after sex. They work best the sooner they are taken after having sex. Most emergency contraceptives are available without a prescription. This method should not be used as your only form of birth control. Barrier methods Female condom  A female condom is a thin sheath that is worn over the penis during sex. Condoms keep sperm from going inside a woman's body. They can be used with a spermicide to increase their effectiveness. They should be disposed after a single use. Female condom  A female condom is a soft, loose-fitting sheath that is put into the vagina before sex. The condom keeps sperm  from going inside a woman's body. They should be disposed after a single use. Diaphragm  A diaphragm is a soft, dome-shaped barrier. It is inserted into the vagina before sex, along with a spermicide. The diaphragm blocks sperm from entering the uterus, and the spermicide kills sperm. A diaphragm should be left in the vagina for 6-8 hours after sex and removed within 24 hours. A diaphragm is prescribed and fitted by a health care provider. A diaphragm should be replaced every 1-2 years, after giving birth, after gaining more than 15 lb (6.8 kg), and after pelvic surgery. Cervical cap  A cervical cap is a round, soft latex or plastic cup that fits over the cervix. It is inserted into the vagina before sex, along with spermicide. It blocks sperm from entering the uterus. The cap should be left in place for 6-8 hours after sex and removed within 48 hours. A cervical cap must be prescribed and fitted by a health care provider. It should be replaced every 2 years. Sponge  A sponge is a soft, circular piece of polyurethane foam with spermicide on it. The sponge helps block sperm from entering the uterus, and the spermicide kills sperm. To use it, you make it wet and then insert it into the vagina. It should be inserted before sex, left in for at least 6 hours after sex, and removed and thrown away within 30 hours. Spermicides Spermicides are chemicals that kill or block sperm from entering the cervix and uterus. They can come as a cream, jelly, suppository, foam, or tablet. A spermicide should be inserted into the   vagina with an applicator at least 10-15 minutes before sex to allow time for it to work. The process must be repeated every time you have sex. Spermicides do not require a prescription. Intrauterine contraception Intrauterine device (IUD) An IUD is a T-shaped device that is put in a woman's uterus. There are two types:  Hormone IUD.This type contains progestin, a synthetic form of the hormone  progesterone. This type can stay in place for 3-5 years.  Copper IUD.This type is wrapped in copper wire. It can stay in place for 10 years.  Permanent methods of contraception Female tubal ligation In this method, a woman's fallopian tubes are sealed, tied, or blocked during surgery to prevent eggs from traveling to the uterus. Hysteroscopic sterilization In this method, a small, flexible insert is placed into each fallopian tube. The inserts cause scar tissue to form in the fallopian tubes and block them, so sperm cannot reach an egg. The procedure takes about 3 months to be effective. Another form of birth control must be used during those 3 months. Female sterilization This is a procedure to tie off the tubes that carry sperm (vasectomy). After the procedure, the man can still ejaculate fluid (semen). Natural planning methods Natural family planning In this method, a couple does not have sex on days when the woman could become pregnant. Calendar method This means keeping track of the length of each menstrual cycle, identifying the days when pregnancy can happen, and not having sex on those days. Ovulation method In this method, a couple avoids sex during ovulation. Symptothermal method This method involves not having sex during ovulation. The woman typically checks for ovulation by watching changes in her temperature and in the consistency of cervical mucus. Post-ovulation method In this method, a couple waits to have sex until after ovulation. Summary  Contraception, also called birth control, means methods or devices that prevent pregnancy.  Hormonal methods of contraception include implants, injections, pills, patches, vaginal rings, and emergency contraceptives.  Barrier methods of contraception can include female condoms, female condoms, diaphragms, cervical caps, sponges, and spermicides.  There are two types of IUDs (intrauterine devices). An IUD can be put in a woman's uterus to  prevent pregnancy for 3-5 years.  Permanent sterilization can be done through a procedure for males, females, or both.  Natural family planning methods involve not having sex on days when the woman could become pregnant. This information is not intended to replace advice given to you by your health care provider. Make sure you discuss any questions you have with your health care provider. Document Released: 06/04/2005 Document Revised: 06/06/2017 Document Reviewed: 07/07/2016 Elsevier Patient Education  2020 Elsevier Inc.  

## 2019-02-18 LAB — CERVICOVAGINAL ANCILLARY ONLY
Bacterial vaginitis: POSITIVE — AB
Candida vaginitis: NEGATIVE
Chlamydia: NEGATIVE
Neisseria Gonorrhea: NEGATIVE
Trichomonas: NEGATIVE

## 2019-02-19 MED ORDER — FLUCONAZOLE 150 MG PO TABS: 150.0000 mg | ORAL_TABLET | Freq: Once | ORAL | 0 refills | Status: AC

## 2019-02-19 MED ORDER — METRONIDAZOLE 500 MG PO TABS: 500.0000 mg | ORAL_TABLET | Freq: Two times a day (BID) | ORAL | 0 refills | Status: AC

## 2019-02-19 NOTE — Addendum Note (Signed)
Addended by: Fatima Blank A on: 02/19/2019 08:43 AM   Modules accepted: Orders

## 2019-05-19 ENCOUNTER — Other Ambulatory Visit: Payer: Self-pay

## 2019-05-19 DIAGNOSIS — Z20822 Contact with and (suspected) exposure to covid-19: Secondary | ICD-10-CM

## 2019-05-20 LAB — NOVEL CORONAVIRUS, NAA: SARS-CoV-2, NAA: NOT DETECTED

## 2019-06-09 ENCOUNTER — Ambulatory Visit: Payer: Medicaid Other | Admitting: Advanced Practice Midwife

## 2019-06-09 ENCOUNTER — Other Ambulatory Visit: Payer: Self-pay

## 2019-06-09 ENCOUNTER — Encounter: Payer: Self-pay | Admitting: Advanced Practice Midwife

## 2019-06-09 ENCOUNTER — Other Ambulatory Visit (HOSPITAL_COMMUNITY)
Admission: RE | Admit: 2019-06-09 | Discharge: 2019-06-09 | Disposition: A | Discharge status: Home / Self Care | Payer: Medicaid Other | Source: Ambulatory Visit | Attending: Advanced Practice Midwife | Admitting: Advanced Practice Midwife

## 2019-06-09 VITALS — BP 108/69 | HR 72 | Ht 62.0 in | Wt 226.6 lb

## 2019-06-09 DIAGNOSIS — Z113 Encounter for screening for infections with a predominantly sexual mode of transmission: Secondary | ICD-10-CM | POA: Insufficient documentation

## 2019-06-09 DIAGNOSIS — Z124 Encounter for screening for malignant neoplasm of cervix: Secondary | ICD-10-CM | POA: Diagnosis not present

## 2019-06-09 NOTE — Patient Instructions (Signed)
Preventing Cervical Cancer  Cervical cancer is cancer that grows on the cervix. The cervix is at the bottom of the uterus. It connects the uterus to the vagina. The uterus is where a baby develops during pregnancy. Cancer occurs when cells become abnormal and start to grow out of control. Cervical cancer grows slowly and may not cause any symptoms at first. Over time, the cancer can grow deep into the cervix tissue and spread to other areas. If it is found early, cervical cancer can be treated effectively. You can also take steps to prevent this type of cancer. Most cases of cervical cancer are caused by an STI (sexually transmitted infection) called human papillomavirus (HPV). One way to reduce your risk of cervical cancer is to avoid infection with the HPV virus. You can do this by practicing safe sex and by getting the HPV vaccine. Getting regular Pap tests is also important because this can help identify changes in cells that could lead to cancer. Your chances of getting this disease can also be reduced by making certain lifestyle changes. How can I protect myself from cervical cancer? Preventing HPV infection  Ask your health care provider about getting the HPV vaccine. If you are 26 years old or younger, you may need to get this vaccine, which is given in three doses over 6 months. This vaccine protects against the types of HPV that could cause cancer.  Limit the number of people you have sex with. Also avoid having sex with people who have had many sex partners.  Use a latex condom during sex. Getting Pap tests  Get Pap tests regularly, starting at age 21. Talk with your health care provider about how often you need these tests. ? Most women who are 21?29 years of age should have a Pap test every 3 years. ? Most women who are 30?28 years of age should have a Pap test in combination with an HPV test every 5 years. ? Women with a higher risk of cervical cancer, such as those with a weakened  immune system or those who have been exposed to the drug diethylstilbestrol (DES), may need more frequent testing. Making other lifestyle changes  Do not use any products that contain nicotine or tobacco, such as cigarettes and e-cigarettes. If you need help quitting, ask your health care provider.  Eat at least 5 servings of fruits and vegetables every day.  Lose weight if you are overweight. Why are these changes important?  These changes and screening tests are designed to address the factors that are known to increase the risk of cervical cancer. Taking these steps is the best way to reduce your risk.  Having regular Pap tests will help identify changes in cells that could lead to cancer. Steps can then be taken to prevent cancer from developing.  These changes will also help find cervical cancer early. This type of cancer can be treated effectively if it is found early. It can be more dangerous and difficult to treat if cancer has grown deep into your cervix or has spread.  In addition to making you less likely to get cervical cancer, these changes will also provide other health benefits, such as the following: ? Practicing safe sex is important for preventing STIs and unplanned pregnancies. ? Avoiding tobacco can reduce your risk for other cancers and health issues. ? Eating a healthy diet and maintaining a healthy weight are good for your overall health. What can happen if changes are not made? In   the early stages, cervical cancer might not have any symptoms. It can take many years for the cancer to grow and get deep into the cervix tissue. This may be happening without you knowing about it. If you develop any symptoms, such as pelvic pain or unusual discharge or bleeding from your vagina, you should see your health care provider right away. If cervical cancer is not found early, you might need treatments such as radiation, chemotherapy, or surgery. In some cases, surgery may mean that  you will not be able to get pregnant or carry a pregnancy to term. Where to find support Talk with your health care provider, school nurse, or local health department for guidance about screening and vaccination. Some children and teens may be able to get the HPV vaccine free of charge through the [place] government's Vaccines for Children (VFC) program. Other places that provide vaccinations include:  Public health clinics. Check with your local health department.  Federally Qualified Health Centers, where you would pay only what you can afford. To find one near you, check this website: www.fqhc.org/find-an-fqhc/  [facility] Clinics. These are part of a program for Medicare and Medicaid patients who live in rural areas. The National Breast and Cervical Cancer Early Detection Program also provides breast and cervical cancer screenings and diagnostic services to low-income, uninsured, and underinsured women. Cervical cancer can be passed down through families. Talk with your health care provider or genetic counselor to learn more about genetic testing for cancer. Where to find more information Learn more about cervical cancer from:  American College of Gynecology: www.acog.org/Patients/FAQs/Cervical-Cancer  American Cancer Society: www.cancer.org/cancer/cervicalcancer/  U.S. Centers for Disease Control and Prevention: www.cdc.gov/cancer/cervical/ Summary  Talk with your health care provider about getting the HPV vaccine.  Be sure to get regular Pap tests as recommended by your health care provider.  See your health care provider right away if you have any pelvic pain or unusual discharge or bleeding from your vagina. This information is not intended to replace advice given to you by your health care provider. Make sure you discuss any questions you have with your health care provider. Document Released: 06/19/2015 Document Revised: 07/06/2017 Document Reviewed: 01/31/2016 Elsevier Patient  Education  2020 Elsevier Inc.  

## 2019-06-09 NOTE — Progress Notes (Signed)
Pt presents for AEX. Pt has no concerns. Last pap 07/2016 Normal. Pt desires to have STD testing

## 2019-06-09 NOTE — Progress Notes (Signed)
  GYNECOLOGY PROGRESS NOTE  History:  28 y.o. G1P1001 presents to [facility] office today for STD screen and Pap.  She had complete well woman exam 01/19/19 but did not have Pap as it was not due.  Last Pap in 2018 was normal.  She reports no gyn concerns or complaints. She is using condoms for contraception.  She denies h/a, dizziness, shortness of breath, n/v, or fever/chills.    The following portions of the patient's history were reviewed and updated as appropriate: allergies, current medications, past family history, past medical history, past social history, past surgical history and problem list. Last pap smear on 08/01/16 and was normal.  Review of Systems:  Pertinent items are noted in HPI.   Objective:  Physical Exam Blood pressure 108/69, pulse 72, height 5\' 2"  (1.575 m), weight 226 lb 9.6 oz (102.8 kg), last menstrual period 05/22/2019. VS reviewed, nursing note reviewed,  Constitutional: well developed, well nourished, no distress HEENT: normocephalic CV: normal rate Pulm/chest wall: normal effort Breast Exam: deferred Abdomen: soft Neuro: alert and oriented x 3 Skin: warm, dry Psych: affect normal Pelvic exam: Cervix pink, visually closed, without lesion, scant white creamy discharge, vaginal walls and external genitalia normal Bimanual exam: Cervix 0/long/high, firm, anterior, neg CMT, uterus nontender, nonenlarged, adnexa without tenderness, enlargement, or mass  Assessment & Plan:  1. Encounter for screening for cervical cancer   - Cytology - PAP( [place])  2. Screen for STD (sexually transmitted disease)  - Cervicovaginal ancillary only( [place]) - HIV antibody - RPR - Hepatitis C antibody - Hepatitis B surface antigen  Fatima Blank, CNM 9:18 AM

## 2019-06-10 LAB — CERVICOVAGINAL ANCILLARY ONLY
Bacterial Vaginitis (gardnerella): NEGATIVE
Candida Glabrata: NEGATIVE
Candida Vaginitis: NEGATIVE
Chlamydia: NEGATIVE
Comment: NEGATIVE
Comment: NEGATIVE
Comment: NEGATIVE
Comment: NEGATIVE
Comment: NEGATIVE
Comment: NORMAL
Neisseria Gonorrhea: NEGATIVE
Trichomonas: NEGATIVE

## 2019-06-10 LAB — HIV ANTIBODY (ROUTINE TESTING W REFLEX): HIV Screen 4th Generation wRfx: NONREACTIVE

## 2019-06-10 LAB — RPR: RPR Ser Ql: NONREACTIVE

## 2019-06-10 LAB — HEPATITIS C ANTIBODY: Hep C Virus Ab: <0.1 s/co ratio (ref 0.0–0.9)

## 2019-06-10 LAB — HEPATITIS B SURFACE ANTIGEN: Hepatitis B Surface Ag: NEGATIVE

## 2019-06-11 LAB — CYTOLOGY - PAP

## 2019-06-15 ENCOUNTER — Telehealth: Payer: Self-pay | Admitting: Advanced Practice Midwife

## 2019-06-15 ENCOUNTER — Encounter: Payer: Self-pay | Admitting: Advanced Practice Midwife

## 2019-06-15 DIAGNOSIS — R87612 Low grade squamous intraepithelial lesion on cytologic smear of cervix (LGSIL): Secondary | ICD-10-CM | POA: Insufficient documentation

## 2019-06-15 NOTE — Telephone Encounter (Signed)
Left message for pt to return call regarding lab results.  Pap on 06/09/19 is abnormal, LSIL, so recommendation is for colposcopy.  Need to schedule pt for colpo in the office soon.

## 2019-06-15 NOTE — Telephone Encounter (Signed)
Pt returned call regarding lab results. Discussed abnormal pap with LSIL with pt, questions answered. Pt to schedule colposcopy at her earliest convenience.

## 2019-07-06 ENCOUNTER — Ambulatory Visit: Payer: Medicaid Other | Admitting: Obstetrics and Gynecology

## 2019-07-06 ENCOUNTER — Other Ambulatory Visit (HOSPITAL_COMMUNITY)
Admission: RE | Admit: 2019-07-06 | Discharge: 2019-07-06 | Disposition: A | Discharge status: Home / Self Care | Payer: Medicaid Other | Source: Ambulatory Visit | Attending: Obstetrics and Gynecology | Admitting: Obstetrics and Gynecology

## 2019-07-06 ENCOUNTER — Encounter: Payer: Self-pay | Admitting: Obstetrics and Gynecology

## 2019-07-06 ENCOUNTER — Other Ambulatory Visit: Payer: Self-pay

## 2019-07-06 VITALS — BP 106/69 | HR 67 | Wt 230.0 lb

## 2019-07-06 DIAGNOSIS — R87612 Low grade squamous intraepithelial lesion on cytologic smear of cervix (LGSIL): Secondary | ICD-10-CM | POA: Diagnosis not present

## 2019-07-06 DIAGNOSIS — Z3202 Encounter for pregnancy test, result negative: Secondary | ICD-10-CM

## 2019-07-06 NOTE — Progress Notes (Signed)
Patient presents for Colpo today.  Pt last had intercourse on 06/26/19   LMP: 06/18/19

## 2019-07-06 NOTE — Progress Notes (Signed)
29 yo with LGSIL here for colposcopy  Patient given informed consent, signed copy in the chart, time out was performed.  Placed in lithotomy position. Cervix viewed with speculum and colposcope after application of acetic acid.   Colposcopy adequate?  yes Acetowhite lesions? Yes 3-4 o'clock region Punctation? no Mosaicism?  no Abnormal vasculature?  no Biopsies? cervical biopsy at 4 o'clock ECC? yes  COMMENTS:  Patient was given post procedure instructions.  She will return in 2 weeks for results.  Catalina Antigua, MD

## 2019-07-07 LAB — POCT URINE PREGNANCY: Preg Test, Ur: NEGATIVE

## 2019-07-08 LAB — SURGICAL PATHOLOGY

## 2019-07-23 ENCOUNTER — Emergency Department (HOSPITAL_COMMUNITY)
Admission: EM | Admit: 2019-07-23 | Discharge: 2019-07-23 | Disposition: A | Discharge status: Home / Self Care | Payer: Commercial Managed Care - PPO | Attending: Emergency Medicine | Admitting: Emergency Medicine

## 2019-07-23 ENCOUNTER — Emergency Department (HOSPITAL_COMMUNITY): Payer: Commercial Managed Care - PPO

## 2019-07-23 ENCOUNTER — Encounter (HOSPITAL_COMMUNITY): Payer: Self-pay

## 2019-07-23 ENCOUNTER — Other Ambulatory Visit: Payer: Self-pay

## 2019-07-23 DIAGNOSIS — Y92410 Unspecified street and highway as the place of occurrence of the external cause: Secondary | ICD-10-CM | POA: Diagnosis not present

## 2019-07-23 DIAGNOSIS — Y9389 Activity, other specified: Secondary | ICD-10-CM | POA: Diagnosis not present

## 2019-07-23 DIAGNOSIS — S42101A Fracture of unspecified part of scapula, right shoulder, initial encounter for closed fracture: Secondary | ICD-10-CM | POA: Insufficient documentation

## 2019-07-23 DIAGNOSIS — Y999 Unspecified external cause status: Secondary | ICD-10-CM | POA: Diagnosis not present

## 2019-07-23 DIAGNOSIS — S4991XA Unspecified injury of right shoulder and upper arm, initial encounter: Secondary | ICD-10-CM | POA: Diagnosis present
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
## 2019-07-23 NOTE — ED Notes (Signed)
Pt transported to Xray. 

## 2019-07-23 NOTE — ED Notes (Addendum)
Discharge instructions discussed with pt. Pt given rigth arm sling. Pt verbalized understanding with no questions at this time. Pt to follow up with ortho

## 2019-07-27 ENCOUNTER — Other Ambulatory Visit: Payer: Self-pay | Admitting: Orthopedic Surgery

## 2019-07-27 DIAGNOSIS — M25511 Pain in right shoulder: Secondary | ICD-10-CM

## 2019-07-31 ENCOUNTER — Ambulatory Visit
Admission: RE | Admit: 2019-07-31 | Discharge: 2019-07-31 | Disposition: A | Discharge status: Home / Self Care | Payer: Medicaid Other | Source: Ambulatory Visit | Attending: Orthopedic Surgery | Admitting: Orthopedic Surgery

## 2019-07-31 ENCOUNTER — Other Ambulatory Visit: Payer: Self-pay

## 2019-07-31 DIAGNOSIS — M25511 Pain in right shoulder: Secondary | ICD-10-CM

## 2019-12-14 ENCOUNTER — Other Ambulatory Visit (HOSPITAL_COMMUNITY)
Admission: RE | Admit: 2019-12-14 | Discharge: 2019-12-14 | Disposition: A | Discharge status: Home / Self Care | Payer: Commercial Managed Care - PPO | Source: Ambulatory Visit | Attending: Obstetrics and Gynecology | Admitting: Obstetrics and Gynecology

## 2019-12-14 ENCOUNTER — Other Ambulatory Visit: Payer: Self-pay

## 2019-12-14 ENCOUNTER — Ambulatory Visit (INDEPENDENT_AMBULATORY_CARE_PROVIDER_SITE_OTHER): Payer: Commercial Managed Care - PPO

## 2019-12-14 DIAGNOSIS — Z113 Encounter for screening for infections with a predominantly sexual mode of transmission: Secondary | ICD-10-CM | POA: Insufficient documentation

## 2019-12-14 DIAGNOSIS — N898 Other specified noninflammatory disorders of vagina: Secondary | ICD-10-CM | POA: Diagnosis present

## 2019-12-14 DIAGNOSIS — N893 Dysplasia of vagina, unspecified: Secondary | ICD-10-CM | POA: Diagnosis not present

## 2019-12-14 NOTE — Progress Notes (Signed)
SUBJECTIVE:  29 y.o. female complains of white vaginal discharge. Pt requests all STD testing. Denies abnormal vaginal bleeding or significant pelvic pain or fever. No UTI symptoms. Denies history of known exposure to STD.   OBJECTIVE:  She appears well, afebrile. Urine dipstick: not done.  ASSESSMENT:  Vaginal Discharge    PLAN:  GC, chlamydia, trichomonas, BVAG, CVAG probe sent to lab. HIV, HEP B, HEP C, RPR drawn today  Treatment: To be determined once lab results are received ROV prn if symptoms persist or worsen.

## 2019-12-14 NOTE — Progress Notes (Signed)
Patient was assessed and managed by nursing staff during this encounter. I have reviewed the chart and agree with the documentation and plan. I have also made any necessary editorial changes.  Catalina Antigua, MD 12/14/2019 1:00 PM

## 2019-12-15 LAB — RPR: RPR Ser Ql: NONREACTIVE

## 2019-12-15 LAB — CERVICOVAGINAL ANCILLARY ONLY
Bacterial Vaginitis (gardnerella): POSITIVE — AB
Candida Glabrata: NEGATIVE
Candida Vaginitis: NEGATIVE
Chlamydia: NEGATIVE
Comment: NEGATIVE
Comment: NEGATIVE
Comment: NEGATIVE
Comment: NEGATIVE
Comment: NEGATIVE
Comment: NORMAL
Neisseria Gonorrhea: NEGATIVE
Trichomonas: NEGATIVE

## 2019-12-15 LAB — HEPATITIS B SURFACE ANTIGEN: Hepatitis B Surface Ag: NEGATIVE

## 2019-12-15 LAB — HEPATITIS C ANTIBODY: Hep C Virus Ab: <0.1 s/co ratio (ref 0.0–0.9)

## 2019-12-15 LAB — HIV ANTIBODY (ROUTINE TESTING W REFLEX): HIV Screen 4th Generation wRfx: NONREACTIVE

## 2019-12-16 MED ORDER — METRONIDAZOLE 500 MG PO TABS: 500.0000 mg | ORAL_TABLET | Freq: Two times a day (BID) | ORAL | 0 refills | Status: DC

## 2019-12-16 NOTE — Addendum Note (Signed)
Addended by: Catalina Antigua on: 12/16/2019 08:07 AM   Modules accepted: Orders

## 2020-03-04 ENCOUNTER — Other Ambulatory Visit: Payer: Commercial Managed Care - PPO

## 2020-03-04 ENCOUNTER — Other Ambulatory Visit: Payer: Self-pay

## 2020-03-04 DIAGNOSIS — Z20822 Contact with and (suspected) exposure to covid-19: Secondary | ICD-10-CM

## 2020-03-07 LAB — NOVEL CORONAVIRUS, NAA: SARS-CoV-2, NAA: NOT DETECTED
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
## 2020-06-26 ENCOUNTER — Other Ambulatory Visit: Payer: Commercial Managed Care - PPO

## 2020-06-27 ENCOUNTER — Other Ambulatory Visit: Payer: Commercial Managed Care - PPO

## 2020-06-27 DIAGNOSIS — Z20822 Contact with and (suspected) exposure to covid-19: Secondary | ICD-10-CM

## 2020-07-01 LAB — NOVEL CORONAVIRUS, NAA: SARS-CoV-2, NAA: NOT DETECTED

## 2020-07-26 ENCOUNTER — Other Ambulatory Visit: Payer: Self-pay

## 2020-07-26 ENCOUNTER — Ambulatory Visit (INDEPENDENT_AMBULATORY_CARE_PROVIDER_SITE_OTHER): Payer: Commercial Managed Care - PPO | Admitting: Obstetrics

## 2020-07-26 ENCOUNTER — Encounter: Payer: Self-pay | Admitting: Obstetrics

## 2020-07-26 ENCOUNTER — Other Ambulatory Visit (HOSPITAL_COMMUNITY)
Admission: RE | Admit: 2020-07-26 | Discharge: 2020-07-26 | Disposition: A | Discharge status: Home / Self Care | Payer: Commercial Managed Care - PPO | Source: Ambulatory Visit | Attending: Obstetrics | Admitting: Obstetrics

## 2020-07-26 VITALS — BP 135/77 | HR 75 | Ht 62.0 in | Wt 238.4 lb

## 2020-07-26 DIAGNOSIS — N898 Other specified noninflammatory disorders of vagina: Secondary | ICD-10-CM | POA: Insufficient documentation

## 2020-07-26 DIAGNOSIS — R87612 Low grade squamous intraepithelial lesion on cytologic smear of cervix (LGSIL): Secondary | ICD-10-CM | POA: Diagnosis not present

## 2020-07-26 DIAGNOSIS — Z6841 Body Mass Index (BMI) 40.0 and over, adult: Secondary | ICD-10-CM

## 2020-07-26 DIAGNOSIS — Z113 Encounter for screening for infections with a predominantly sexual mode of transmission: Secondary | ICD-10-CM

## 2020-07-26 DIAGNOSIS — Z01419 Encounter for gynecological examination (general) (routine) without abnormal findings: Secondary | ICD-10-CM | POA: Diagnosis not present

## 2020-07-26 NOTE — Progress Notes (Signed)
Patient presents for AEX. Patient desires STD testing.   Last Pap: 05/2019 LGSIL

## 2020-07-26 NOTE — Progress Notes (Signed)
Subjective:        Robin Kaufman is a 30 y.o. female here for a routine exam.  Current complaints: Vaginal discharge.    Personal health questionnaire:  Is patient [name], have a family history of breast and/or ovarian cancer: no Is there a family history of uterine cancer diagnosed at age < 65, gastrointestinal cancer, urinary tract cancer, family member who is a Personnel officer syndrome-associated carrier: no Is the patient overweight and hypertensive, family history of diabetes, personal history of gestational diabetes, preeclampsia or PCOS: yes Is patient over 63, have PCOS,  family history of premature CHD under age 73, diabetes, smoke, have hypertension or peripheral artery disease:  no At any time, has a partner hit, kicked or otherwise hurt or frightened you?: no Over the past 2 weeks, have you felt down, depressed or hopeless?: no Over the past 2 weeks, have you felt little interest or pleasure in doing things?:no   Gynecologic History Patient's last menstrual period was 07/16/2020. Contraception: none Last Pap: 06-09-2019. Results were: LGSIL.  Colposcopy: Negative for Dysplasia Last mammogram: n/a. Results were: n/a  Obstetric History OB History  Gravida Para Term Preterm AB Living  1 1 1     1   SAB IAB Ectopic Multiple Live Births          1    # Outcome Date GA Lbr Len/2nd Weight Sex Delivery Anes PTL Lv  1 Term 12/29/06 [redacted]w[redacted]d  6 lb 4.1 oz (2.838 kg) M Vag-Spont EPI  LIV    History reviewed. No pertinent past medical history.  Past Surgical History:  Procedure Laterality Date  . APPENDECTOMY    . COLON SURGERY     Partially removed     Current Outpatient Medications:  .  amoxicillin (AMOXIL) 500 MG capsule, Take 500 mg by mouth 3 (three) times daily., Disp: , Rfl:  .  metroNIDAZOLE (FLAGYL) 500 MG tablet, Take 1 tablet (500 mg total) by mouth 2 (two) times daily. (Patient not taking: Reported on 07/26/2020), Disp: 14 tablet, Rfl: 0 No Known Allergies   Social History   Tobacco Use  . Smoking status: Never Smoker  . Smokeless tobacco: Never Used  Substance Use Topics  . Alcohol use: Yes    Comment: occassionally    Family History  Problem Relation Age of Onset  . Diabetes Mother   . Hypertension Mother       Review of Systems  Constitutional: negative for fatigue and weight loss Respiratory: negative for cough and wheezing Cardiovascular: negative for chest pain, fatigue and palpitations Gastrointestinal: negative for abdominal pain and change in bowel habits Musculoskeletal:negative for myalgias Neurological: negative for gait problems and tremors Behavioral/Psych: negative for abusive relationship, depression Endocrine: negative for temperature intolerance    Genitourinary:negative for abnormal menstrual periods, genital lesions, hot flashes, sexual problems.  Positive for vaginal discharge Integument/breast: negative for breast lump, breast tenderness, nipple discharge and skin lesion(s)    Objective:       BP 135/77   Pulse 75   Ht 5\' 2"  (1.575 m)   Wt 238 lb 6.4 oz (108.1 kg)   LMP 07/16/2020   BMI 43.60 kg/m  General:   alert and no distress  Skin:   no rash or abnormalities  Lungs:   clear to auscultation bilaterally  Heart:   regular rate and rhythm, S1, S2 normal, no murmur, click, rub or gallop  Breasts:   normal without suspicious masses, skin or nipple changes or axillary nodes  Abdomen:  normal findings: no organomegaly, soft, non-tender and no hernia  Pelvis:  External genitalia: normal general appearance Urinary system: urethral meatus normal and bladder without fullness, nontender Vaginal: normal without tenderness, induration or masses Cervix: normal appearance Adnexa: normal bimanual exam Uterus: anteverted and non-tender, normal size   Lab Review Urine pregnancy test Labs reviewed yes Radiologic studies reviewed no  50% of 20 min visit spent on counseling and coordination of care.    Assessment:     1. Encounter for routine gynecological examination with Papanicolaou smear of cervix Rx: - Cytology - PAP( [place])  2. Vaginal discharge Rx: - Cervicovaginal ancillary only( [place])  3. Screening for STD (sexually transmitted disease) Rx: - Hepatitis B surface antigen - Hepatitis C antibody - HIV Antibody (routine testing w rflx) - RPR  4. Low grade squamous intraepithelial lesion (LGSIL) on cervical Pap smear - negative colposcopy  5. Class 3 severe obesity due to excess calories without serious comorbidity with body mass index (BMI) of 40.0 to 44.9 in adult [facility]) - program of caloric reduction, exercise and behavioral modification recommended    Plan:    Education reviewed: calcium supplements, depression evaluation, low fat, low cholesterol diet, safe sex/STD prevention, self breast exams and weight bearing exercise. Contraception: none. Follow up in: 1 year.   No orders of the defined types were placed in this encounter.  Orders Placed This Encounter  Procedures  . Hepatitis B surface antigen  . Hepatitis C antibody  . HIV Antibody (routine testing w rflx)  . RPR    Brock Bad, MD 07/26/2020 10:15 AM

## 2020-07-27 LAB — CERVICOVAGINAL ANCILLARY ONLY
Bacterial Vaginitis (gardnerella): POSITIVE — AB
Candida Glabrata: NEGATIVE
Candida Vaginitis: NEGATIVE
Chlamydia: NEGATIVE
Comment: NEGATIVE
Comment: NEGATIVE
Comment: NEGATIVE
Comment: NEGATIVE
Comment: NEGATIVE
Comment: NORMAL
Neisseria Gonorrhea: NEGATIVE
Trichomonas: NEGATIVE

## 2020-07-27 LAB — HEPATITIS C ANTIBODY: Hep C Virus Ab: <0.1 s/co ratio (ref 0.0–0.9)

## 2020-07-27 LAB — HIV ANTIBODY (ROUTINE TESTING W REFLEX): HIV Screen 4th Generation wRfx: NONREACTIVE

## 2020-07-27 LAB — HEPATITIS B SURFACE ANTIGEN: Hepatitis B Surface Ag: NEGATIVE

## 2020-07-27 LAB — RPR: RPR Ser Ql: NONREACTIVE

## 2020-07-28 ENCOUNTER — Other Ambulatory Visit: Payer: Self-pay | Admitting: Obstetrics

## 2020-07-28 DIAGNOSIS — B9689 Other specified bacterial agents as the cause of diseases classified elsewhere: Secondary | ICD-10-CM

## 2020-07-28 MED ORDER — METRONIDAZOLE 500 MG PO TABS: 500.0000 mg | ORAL_TABLET | Freq: Two times a day (BID) | ORAL | 0 refills | Status: DC

## 2020-08-01 LAB — CYTOLOGY - PAP: Diagnosis: NEGATIVE

## 2020-10-13 ENCOUNTER — Telehealth: Payer: Self-pay

## 2020-10-13 NOTE — Telephone Encounter (Signed)
Received my chart message from patient. She is requesting a appointment for STD testing and annual exam. Call patent- no answer or voicemail to leave a message.

## 2020-12-12 ENCOUNTER — Other Ambulatory Visit (HOSPITAL_COMMUNITY)
Admission: RE | Admit: 2020-12-12 | Discharge: 2020-12-12 | Disposition: A | Discharge status: Home / Self Care | Payer: Commercial Managed Care - PPO | Source: Ambulatory Visit | Attending: Obstetrics and Gynecology | Admitting: Obstetrics and Gynecology

## 2020-12-12 ENCOUNTER — Other Ambulatory Visit: Payer: Self-pay

## 2020-12-12 ENCOUNTER — Ambulatory Visit (INDEPENDENT_AMBULATORY_CARE_PROVIDER_SITE_OTHER): Payer: Commercial Managed Care - PPO

## 2020-12-12 DIAGNOSIS — N898 Other specified noninflammatory disorders of vagina: Secondary | ICD-10-CM

## 2020-12-12 DIAGNOSIS — Z113 Encounter for screening for infections with a predominantly sexual mode of transmission: Secondary | ICD-10-CM | POA: Diagnosis not present

## 2020-12-12 NOTE — Progress Notes (Signed)
Patient was assessed and managed by nursing staff during this encounter. I have reviewed the chart and agree with the documentation and plan. I have also made any necessary editorial changes.  Warden Fillers, MD 12/12/2020 9:25 AM

## 2020-12-12 NOTE — Progress Notes (Signed)
SUBJECTIVE:  30 y.o. female reports she has a new partner and requests all vaginal/oral STD testing. Pt complains of white vaginal discharge for 1 week. Denies abnormal vaginal bleeding or significant pelvic pain or fever. No UTI symptoms.   LMP 12/05/20  OBJECTIVE:  She appears well, afebrile. Urine dipstick: not done.  ASSESSMENT:  Vaginal Discharge    PLAN:  Vaginal/oral probe GC, chlamydia, trichomonas, BVAG, CVAG probe sent to lab. Treatment: To be determined once lab results are reviewed by the provider  ROV prn if symptoms persist or worsen.

## 2020-12-13 ENCOUNTER — Other Ambulatory Visit: Payer: Self-pay

## 2020-12-13 DIAGNOSIS — B9689 Other specified bacterial agents as the cause of diseases classified elsewhere: Secondary | ICD-10-CM

## 2020-12-13 DIAGNOSIS — N760 Acute vaginitis: Secondary | ICD-10-CM

## 2020-12-13 LAB — RPR: RPR Ser Ql: NONREACTIVE

## 2020-12-13 LAB — GC/CHLAMYDIA PROBE AMP (~~LOC~~) NOT AT ARMC
Chlamydia: NEGATIVE
Comment: NEGATIVE
Comment: NORMAL
Neisseria Gonorrhea: NEGATIVE

## 2020-12-13 LAB — CERVICOVAGINAL ANCILLARY ONLY
Bacterial Vaginitis (gardnerella): NEGATIVE
Candida Glabrata: NEGATIVE
Candida Vaginitis: NEGATIVE
Chlamydia: NEGATIVE
Comment: NEGATIVE
Comment: NEGATIVE
Comment: NEGATIVE
Comment: NEGATIVE
Comment: NEGATIVE
Comment: NORMAL
Neisseria Gonorrhea: NEGATIVE
Trichomonas: NEGATIVE

## 2020-12-13 LAB — HEPATITIS C ANTIBODY: Hep C Virus Ab: <0.1 s/co ratio (ref 0.0–0.9)

## 2020-12-13 LAB — HEPATITIS B SURFACE ANTIGEN: Hepatitis B Surface Ag: NEGATIVE

## 2020-12-13 LAB — HIV ANTIBODY (ROUTINE TESTING W REFLEX): HIV Screen 4th Generation wRfx: NONREACTIVE

## 2021-04-13 ENCOUNTER — Ambulatory Visit (INDEPENDENT_AMBULATORY_CARE_PROVIDER_SITE_OTHER): Payer: BLUE CROSS/BLUE SHIELD | Admitting: *Deleted

## 2021-04-13 ENCOUNTER — Other Ambulatory Visit: Payer: Self-pay

## 2021-04-13 ENCOUNTER — Other Ambulatory Visit (HOSPITAL_COMMUNITY)
Admission: RE | Admit: 2021-04-13 | Discharge: 2021-04-13 | Disposition: A | Discharge status: Home / Self Care | Payer: BLUE CROSS/BLUE SHIELD | Source: Ambulatory Visit | Attending: Obstetrics | Admitting: Obstetrics

## 2021-04-13 VITALS — BP 118/77 | HR 60

## 2021-04-13 DIAGNOSIS — Z113 Encounter for screening for infections with a predominantly sexual mode of transmission: Secondary | ICD-10-CM | POA: Insufficient documentation

## 2021-04-13 NOTE — Progress Notes (Signed)
SUBJECTIVE:  30 y.o. female who desires a STI screen. Denies abnormal vaginal discharge, bleeding or significant pelvic pain. No UTI symptoms. Denies history of known exposure to STD.  LMP: 03/24/21  OBJECTIVE:  She appears well.   ASSESSMENT:  STI Screen   PLAN:  Pt offered STI blood screening-requested GC, chlamydia, and trichomonas probe sent to lab.  Treatment: To be determined once lab results are received.  Pt follow up as needed.

## 2021-04-14 LAB — HIV ANTIBODY (ROUTINE TESTING W REFLEX): HIV Screen 4th Generation wRfx: NONREACTIVE

## 2021-04-14 LAB — HEPATITIS C ANTIBODY: Hep C Virus Ab: <0.1 s/co ratio (ref 0.0–0.9)

## 2021-04-14 LAB — CERVICOVAGINAL ANCILLARY ONLY
Chlamydia: NEGATIVE
Comment: NEGATIVE
Comment: NEGATIVE
Comment: NORMAL
Neisseria Gonorrhea: NEGATIVE
Trichomonas: NEGATIVE

## 2021-04-14 LAB — RPR: RPR Ser Ql: NONREACTIVE

## 2021-04-14 LAB — HEPATITIS B SURFACE ANTIGEN: Hepatitis B Surface Ag: NEGATIVE

## 2021-10-19 ENCOUNTER — Ambulatory Visit (INDEPENDENT_AMBULATORY_CARE_PROVIDER_SITE_OTHER): Payer: Commercial Managed Care - PPO | Admitting: Obstetrics and Gynecology

## 2021-10-19 ENCOUNTER — Encounter: Payer: Self-pay | Admitting: Obstetrics and Gynecology

## 2021-10-19 ENCOUNTER — Other Ambulatory Visit (HOSPITAL_COMMUNITY)
Admission: RE | Admit: 2021-10-19 | Discharge: 2021-10-19 | Disposition: A | Discharge status: Home / Self Care | Payer: Commercial Managed Care - PPO | Source: Ambulatory Visit | Attending: Obstetrics and Gynecology | Admitting: Obstetrics and Gynecology

## 2021-10-19 VITALS — BP 115/75 | HR 64 | Ht 63.0 in | Wt 227.0 lb

## 2021-10-19 DIAGNOSIS — Z6841 Body Mass Index (BMI) 40.0 and over, adult: Secondary | ICD-10-CM

## 2021-10-19 DIAGNOSIS — Z01419 Encounter for gynecological examination (general) (routine) without abnormal findings: Secondary | ICD-10-CM

## 2021-10-19 DIAGNOSIS — Z113 Encounter for screening for infections with a predominantly sexual mode of transmission: Secondary | ICD-10-CM

## 2021-10-19 NOTE — Progress Notes (Signed)
? ? ?GYNECOLOGY ANNUAL PREVENTATIVE CARE ENCOUNTER NOTE ? ?History:    ? Yari Moeder is a 31 y.o. G62P1001 female here for a routine annual gynecologic exam.  Current complaints: none.   Denies abnormal vaginal bleeding, discharge, pelvic pain, problems with intercourse or other gynecologic concerns.  ?  ?Gynecologic History ?Patient's last menstrual period was 09/25/2021. ?Contraception: none ?Last Pap: 07/26/20. Results were: normal with negative HPV ? ? ?Obstetric History ?OB History  ?Gravida Para Term Preterm AB Living  ?1 1 1     1   ?SAB IAB Ectopic Multiple Live Births  ?        1  ?  ?# Outcome Date GA Lbr Len/2nd Weight Sex Delivery Anes PTL Lv  ?1 Term 12/29/06 [redacted]w[redacted]d  2.838 kg M Vag-Spont EPI  LIV  ? ? ?History reviewed. No pertinent past medical history. ? ?Past Surgical History:  ?Procedure Laterality Date  ? APPENDECTOMY    ? COLON SURGERY    ? Partially removed  ? ? ?Current Outpatient Medications on File Prior to Visit  ?Medication Sig Dispense Refill  ? amoxicillin (AMOXIL) 500 MG capsule Take 500 mg by mouth 3 (three) times daily. (Patient not taking: Reported on 10/19/2021)    ? metroNIDAZOLE (FLAGYL) 500 MG tablet Take 1 tablet (500 mg total) by mouth 2 (two) times daily. (Patient not taking: Reported on 10/19/2021) 14 tablet 0  ? ?No current facility-administered medications on file prior to visit.  ? ? ?No Known Allergies ? ?Social History:  reports that she has been smoking e-cigarettes. She has never used smokeless tobacco. She reports current alcohol use. She reports that she does not use drugs. ? ?Family History  ?Problem Relation Age of Onset  ? Diabetes Mother   ? Hypertension Mother   ? ? ?The following portions of the patient's history were reviewed and updated as appropriate: allergies, current medications, past family history, past medical history, past social history, past surgical history and problem list. ? ?Review of Systems ?Pertinent items noted in HPI and remainder of  comprehensive ROS otherwise negative. ? ?Physical Exam:  ?BP 115/75   Pulse 64   Ht 5\' 3"  (1.6 m)   Wt 103 kg   LMP 09/25/2021   BMI 40.21 kg/m?  ?CONSTITUTIONAL: Well-developed, well-nourished female in no acute distress.  ?HENT:  Normocephalic, atraumatic, External right and left ear normal. Oropharynx is clear and moist ?EYES: Conjunctivae and EOM are normal.  ?NECK: Normal range of motion, supple, no masses.  Normal thyroid.  ?SKIN: Skin is warm and dry. No rash noted. Not diaphoretic. No erythema. No pallor. ?MUSCULOSKELETAL: Normal range of motion. No tenderness.  No cyanosis, clubbing, or edema.  2+ distal pulses. ?NEUROLOGIC: Alert and oriented to person, place, and time. Normal reflexes, muscle tone coordination.  ?PSYCHIATRIC: Normal mood and affect. Normal behavior. Normal judgment and thought content. ?CARDIOVASCULAR: Normal heart rate noted, regular rhythm ?RESPIRATORY: Clear to auscultation bilaterally. Effort and breath sounds normal, no problems with respiration noted. ?BREASTS: Symmetric in size. No masses, tenderness, skin changes, nipple drainage, or lymphadenopathy bilaterally. Performed in the presence of a chaperone. ?ABDOMEN: Soft, no distention noted.  No tenderness, rebound or guarding.  ?PELVIC: Normal appearing external genitalia and urethral meatus; normal appearing vaginal mucosa and cervix.  No abnormal discharge noted.  Pap smear obtained. Vaginal swab obtained. Normal uterine size, no other palpable masses, no uterine or adnexal tenderness.  Performed in the presence of a chaperone. ?  ?Assessment and Plan:  ?  1.  Routine screening for STI (sexually transmitted infection) ?Per pt request ?- Cervicovaginal ancillary only( [place]) ?- HIV antibody (with reflex) ?- RPR ?- Hepatitis B Surface AntiGEN ?- Hepatitis C Antibody ? ?2. Women's annual routine gynecological examination ?Normal annual exam ?Pt declines birth control ?Pap per pt request today ?- Cytology - PAP( [facility]) ? ?Will follow up results of pap smear and manage accordingly. ?Routine preventative health maintenance measures emphasized. ?Please refer to After Visit Summary for other counseling recommendations.  ?F/u in 1 year with annual exam ?   ? ?Lynnda Shields, MD, FACOG ?Obstetrician Social research officer, government, Faculty Practice ?[facility]  ?

## 2021-10-19 NOTE — Progress Notes (Signed)
Pt is in the office for annual. ?Last pap 07/26/20 ?Regular cycles, LMP 09/25/21 ?Pt would like STD testing today including blood work ?Declines BC ?

## 2021-10-20 LAB — HEPATITIS B SURFACE ANTIGEN: Hepatitis B Surface Ag: NEGATIVE

## 2021-10-20 LAB — HEPATITIS C ANTIBODY: Hep C Virus Ab: NONREACTIVE

## 2021-10-20 LAB — CERVICOVAGINAL ANCILLARY ONLY
Bacterial Vaginitis (gardnerella): NEGATIVE
Candida Glabrata: NEGATIVE
Candida Vaginitis: NEGATIVE
Chlamydia: NEGATIVE
Comment: NEGATIVE
Comment: NEGATIVE
Comment: NEGATIVE
Comment: NEGATIVE
Comment: NEGATIVE
Comment: NORMAL
Neisseria Gonorrhea: NEGATIVE
Trichomonas: NEGATIVE

## 2021-10-20 LAB — HIV ANTIBODY (ROUTINE TESTING W REFLEX): HIV Screen 4th Generation wRfx: NONREACTIVE

## 2021-10-20 LAB — RPR: RPR Ser Ql: NONREACTIVE

## 2021-10-23 LAB — CYTOLOGY - PAP
Comment: NEGATIVE
Diagnosis: NEGATIVE
High risk HPV: NEGATIVE

## 2022-07-02 ENCOUNTER — Ambulatory Visit (INDEPENDENT_AMBULATORY_CARE_PROVIDER_SITE_OTHER): Payer: Commercial Managed Care - PPO | Admitting: Emergency Medicine

## 2022-07-02 ENCOUNTER — Other Ambulatory Visit (HOSPITAL_COMMUNITY)
Admission: RE | Admit: 2022-07-02 | Discharge: 2022-07-02 | Disposition: A | Discharge status: Home / Self Care | Payer: Commercial Managed Care - PPO | Source: Ambulatory Visit | Attending: Obstetrics and Gynecology | Admitting: Obstetrics and Gynecology

## 2022-07-02 ENCOUNTER — Ambulatory Visit: Payer: Commercial Managed Care - PPO

## 2022-07-02 VITALS — BP 105/70 | HR 81 | Ht 62.0 in | Wt 236.4 lb

## 2022-07-02 DIAGNOSIS — Z113 Encounter for screening for infections with a predominantly sexual mode of transmission: Secondary | ICD-10-CM

## 2022-07-02 NOTE — Progress Notes (Signed)
SUBJECTIVE:  32 y.o. female  desires STD screen. Denies abnormal vaginal bleeding or significant pelvic pain or fever. No UTI symptoms. Denies history of known exposure to STD.  LMP: 06/27/2022  OBJECTIVE:  She appears well.   PLAN:  Pt offered STI blood screening-requested GC, chlamydia, and trichomonas probe sent to lab.  Treatment: To be determined once lab results are received.

## 2022-07-03 LAB — RPR: RPR Ser Ql: NONREACTIVE

## 2022-07-03 LAB — CERVICOVAGINAL ANCILLARY ONLY
Chlamydia: NEGATIVE
Comment: NEGATIVE
Comment: NEGATIVE
Comment: NORMAL
Neisseria Gonorrhea: NEGATIVE
Trichomonas: NEGATIVE

## 2022-07-03 LAB — HEPATITIS C ANTIBODY: Hep C Virus Ab: NONREACTIVE

## 2022-07-03 LAB — HEPATITIS B SURFACE ANTIGEN: Hepatitis B Surface Ag: NEGATIVE

## 2022-07-03 LAB — HIV ANTIBODY (ROUTINE TESTING W REFLEX): HIV Screen 4th Generation wRfx: NONREACTIVE

## 2022-12-12 ENCOUNTER — Ambulatory Visit (INDEPENDENT_AMBULATORY_CARE_PROVIDER_SITE_OTHER): Payer: Commercial Managed Care - PPO

## 2022-12-12 ENCOUNTER — Other Ambulatory Visit (HOSPITAL_COMMUNITY)
Admission: RE | Admit: 2022-12-12 | Discharge: 2022-12-12 | Disposition: A | Discharge status: Home / Self Care | Payer: Commercial Managed Care - PPO | Source: Ambulatory Visit | Attending: Obstetrics & Gynecology | Admitting: Obstetrics & Gynecology

## 2022-12-12 VITALS — Ht 62.0 in | Wt 233.9 lb

## 2022-12-12 DIAGNOSIS — Z113 Encounter for screening for infections with a predominantly sexual mode of transmission: Secondary | ICD-10-CM | POA: Diagnosis present

## 2022-12-12 NOTE — Progress Notes (Signed)
SUBJECTIVE:  32 y.o. female who desires a STI screen. Denies abnormal vaginal discharge, bleeding or significant pelvic pain. No UTI symptoms. Denies history of known exposure to STD.  Patient's last menstrual period was 12/08/2022 (exact date).  OBJECTIVE:  She appears well.   ASSESSMENT:  STI Screen   PLAN:  Pt offered STI blood screening-requested GC, chlamydia, and trichomonas probe sent to lab.  Treatment: To be determined once lab results are received.  Pt follow up as needed.

## 2022-12-13 LAB — CERVICOVAGINAL ANCILLARY ONLY
Chlamydia: NEGATIVE
Comment: NEGATIVE
Comment: NEGATIVE
Comment: NORMAL
Neisseria Gonorrhea: NEGATIVE
Trichomonas: NEGATIVE

## 2022-12-13 LAB — HEPATITIS B SURFACE ANTIGEN: Hepatitis B Surface Ag: NEGATIVE

## 2022-12-13 LAB — HEPATITIS C ANTIBODY: Hep C Virus Ab: NONREACTIVE

## 2022-12-13 LAB — HIV ANTIBODY (ROUTINE TESTING W REFLEX): HIV Screen 4th Generation wRfx: NONREACTIVE

## 2022-12-13 LAB — RPR: RPR Ser Ql: NONREACTIVE

## 2023-05-08 ENCOUNTER — Ambulatory Visit: Payer: Commercial Managed Care - PPO

## 2023-05-08 ENCOUNTER — Other Ambulatory Visit (HOSPITAL_COMMUNITY)
Admission: RE | Admit: 2023-05-08 | Discharge: 2023-05-08 | Disposition: A | Discharge status: Home / Self Care | Payer: Commercial Managed Care - PPO | Source: Ambulatory Visit | Attending: Obstetrics and Gynecology | Admitting: Obstetrics and Gynecology

## 2023-05-08 VITALS — BP 111/72 | HR 71 | Wt 238.0 lb

## 2023-05-08 DIAGNOSIS — Z113 Encounter for screening for infections with a predominantly sexual mode of transmission: Secondary | ICD-10-CM

## 2023-05-08 NOTE — Progress Notes (Signed)
SUBJECTIVE:  32 y.o. female desires routine STI check. Denies abnormal vaginal discharge, bleeding or significant pelvic pain or fever. No UTI symptoms. Denies history of known exposure to STD.  No LMP recorded.  OBJECTIVE:  She appears well, afebrile. Urine dipstick: not done.  ASSESSMENT:  Vaginal Discharge  Vaginal Odor   PLAN:  GC, chlamydia, trichomonas, BVAG, CVAG probe sent to lab. Treatment: To be determined once lab results are received ROV prn if symptoms persist or worsen.

## 2023-05-09 LAB — RPR: RPR Ser Ql: NONREACTIVE

## 2023-05-09 LAB — CERVICOVAGINAL ANCILLARY ONLY
Bacterial Vaginitis (gardnerella): POSITIVE — AB
Candida Glabrata: NEGATIVE
Candida Vaginitis: NEGATIVE
Chlamydia: NEGATIVE
Comment: NEGATIVE
Comment: NEGATIVE
Comment: NEGATIVE
Comment: NEGATIVE
Comment: NEGATIVE
Comment: NORMAL
Neisseria Gonorrhea: NEGATIVE
Trichomonas: NEGATIVE

## 2023-05-09 LAB — HEPATITIS B SURFACE ANTIGEN: Hepatitis B Surface Ag: NEGATIVE

## 2023-05-09 LAB — HIV ANTIBODY (ROUTINE TESTING W REFLEX): HIV Screen 4th Generation wRfx: NONREACTIVE

## 2023-05-09 LAB — HEPATITIS C ANTIBODY: Hep C Virus Ab: NONREACTIVE

## 2023-05-20 ENCOUNTER — Other Ambulatory Visit: Payer: Self-pay | Admitting: *Deleted

## 2023-05-20 DIAGNOSIS — B9689 Other specified bacterial agents as the cause of diseases classified elsewhere: Secondary | ICD-10-CM

## 2023-05-20 MED ORDER — METRONIDAZOLE 500 MG PO TABS
500.0000 mg | ORAL_TABLET | Freq: Two times a day (BID) | ORAL | 0 refills | Status: DC
Start: 2023-05-20 — End: 2024-02-07

## 2023-05-20 NOTE — Progress Notes (Signed)
Flagyl sent for +BV, pt sent msg via mychart.

## 2023-10-01 ENCOUNTER — Other Ambulatory Visit (HOSPITAL_COMMUNITY)
Admission: RE | Admit: 2023-10-01 | Discharge: 2023-10-01 | Disposition: A | Discharge status: Home / Self Care | Source: Ambulatory Visit | Attending: Obstetrics and Gynecology | Admitting: Obstetrics and Gynecology

## 2023-10-01 ENCOUNTER — Ambulatory Visit

## 2023-10-01 VITALS — BP 122/74 | HR 86

## 2023-10-01 DIAGNOSIS — Z113 Encounter for screening for infections with a predominantly sexual mode of transmission: Secondary | ICD-10-CM

## 2023-10-01 NOTE — Progress Notes (Signed)
 SUBJECTIVE:  33 y.o. female who desires a STI screen. Denies abnormal vaginal discharge, bleeding or significant pelvic pain. No UTI symptoms. Denies history of known exposure to STD.  No LMP recorded.  OBJECTIVE:  She appears well.   ASSESSMENT:  STI Screen   PLAN:  Pt offered STI blood screening-requested GC, chlamydia, and trichomonas probe sent to lab.  Treatment: To be determined once lab results are received.  Pt follow up as needed.

## 2023-10-02 ENCOUNTER — Encounter: Payer: Self-pay | Admitting: Obstetrics and Gynecology

## 2023-10-02 LAB — CERVICOVAGINAL ANCILLARY ONLY
Bacterial Vaginitis (gardnerella): NEGATIVE
Candida Glabrata: NEGATIVE
Candida Vaginitis: NEGATIVE
Chlamydia: NEGATIVE
Comment: NEGATIVE
Comment: NEGATIVE
Comment: NEGATIVE
Comment: NEGATIVE
Comment: NEGATIVE
Comment: NORMAL
Neisseria Gonorrhea: NEGATIVE
Trichomonas: NEGATIVE

## 2023-10-02 LAB — HEPATITIS C ANTIBODY: Hep C Virus Ab: NONREACTIVE

## 2023-10-02 LAB — HEPATITIS B SURFACE ANTIGEN: Hepatitis B Surface Ag: NEGATIVE

## 2023-10-02 LAB — RPR: RPR Ser Ql: NONREACTIVE

## 2023-10-02 LAB — HIV ANTIBODY (ROUTINE TESTING W REFLEX): HIV Screen 4th Generation wRfx: NONREACTIVE

## 2024-01-16 ENCOUNTER — Encounter: Payer: Self-pay | Admitting: Physician Assistant

## 2024-01-16 ENCOUNTER — Ambulatory Visit (INDEPENDENT_AMBULATORY_CARE_PROVIDER_SITE_OTHER): Admitting: Physician Assistant

## 2024-01-16 ENCOUNTER — Other Ambulatory Visit (HOSPITAL_COMMUNITY)
Admission: RE | Admit: 2024-01-16 | Discharge: 2024-01-16 | Disposition: A | Discharge status: Home / Self Care | Source: Ambulatory Visit | Attending: Physician Assistant | Admitting: Physician Assistant

## 2024-01-16 VITALS — BP 133/86 | HR 70 | Ht 63.0 in | Wt 241.6 lb

## 2024-01-16 DIAGNOSIS — N9089 Other specified noninflammatory disorders of vulva and perineum: Secondary | ICD-10-CM

## 2024-01-16 DIAGNOSIS — Z113 Encounter for screening for infections with a predominantly sexual mode of transmission: Secondary | ICD-10-CM

## 2024-01-16 DIAGNOSIS — Z1331 Encounter for screening for depression: Secondary | ICD-10-CM | POA: Diagnosis not present

## 2024-01-16 NOTE — Progress Notes (Signed)
 GYNECOLOGY  VISIT   HPI: Robin Kaufman is a 33 y.o.   single female G1P1001 here for what she reports is a cyst on vulva. She first noticed it in February when it was much smaller, reports it has grown to size of a testicle. It would temporarily reduce in size with sitz baths. She has mild-moderate pain with sitting in certain positions, not affecting ambulation. She does not notice drainage or swelling, and this has never happened previously. Patient denies fever, abdominal pain, pelvic pain, dyspareunia, dysuria, urinary frequency/urgency.   GYNECOLOGIC HISTORY: No LMP recorded. Contraception: none Menopausal hormone therapy:  premenopausal Last mammogram:  Never previously done due to age Last pap smear:  Diagnosis  Date Value Ref Range Status  10/19/2021   Final   - Negative for intraepithelial lesion or malignancy (NILM)           OB History     Gravida  1   Para  1   Term  1   Preterm      AB      Living  1      SAB      IAB      Ectopic      Multiple      Live Births  1              Patient Active Problem List   Diagnosis Date Noted   BMI 40.0-44.9, adult (HCC) 10/19/2021   Low grade squamous intraepithelial lesion (LGSIL) on cervical Pap smear 06/15/2019   High BMI 07/31/2013   Family history of diabetes mellitus (DM) 07/31/2013    History reviewed. No pertinent past medical history.  Past Surgical History:  Procedure Laterality Date   APPENDECTOMY     COLON SURGERY     Partially removed    Current Outpatient Medications  Medication Sig Dispense Refill   amoxicillin (AMOXIL) 500 MG capsule Take 500 mg by mouth 3 (three) times daily. (Patient not taking: Reported on 10/01/2023)     metroNIDAZOLE  (FLAGYL ) 500 MG tablet Take 1 tablet (500 mg total) by mouth 2 (two) times daily. (Patient not taking: Reported on 10/01/2023) 14 tablet 0   No current facility-administered medications for this visit.     ALLERGIES: Patient has no known  allergies.  Family History  Problem Relation Age of Onset   Diabetes Mother    Hypertension Mother     Social History   Socioeconomic History   Marital status: Single    Spouse name: Not on file   Number of children: Not on file   Years of education: Not on file   Highest education level: Not on file  Occupational History   Not on file  Tobacco Use   Smoking status: Former    Types: E-cigarettes   Smokeless tobacco: Never  Vaping Use   Vaping status: Never Used  Substance and Sexual Activity   Alcohol use: Yes    Comment: occassionally   Drug use: No   Sexual activity: Yes    Partners: Male    Birth control/protection: None  Other Topics Concern   Not on file  Social History Narrative   Not on file   Social Drivers of Health   Financial Resource Strain: Not on file  Food Insecurity: Not on file  Transportation Needs: Not on file  Physical Activity: Not on file  Stress: Not on file  Social Connections: Not on file  Intimate Partner Violence: Not on file    Review  of Systems  PHYSICAL EXAMINATION:    BP 133/86   Pulse 70   Ht 5' 3 (1.6 m)   Wt 241 lb 9.6 oz (109.6 kg)   BMI 42.80 kg/m     General appearance: alert, cooperative and appears stated age Head: Normocephalic, without obvious abnormality, atraumatic Neck: Symmetrical, trachea midline and thyroid normal to inspection  Lungs: Normal respiratory effort Heart: regular rate  Extremities: extremities normal, atraumatic, no cyanosis or edema Skin: Skin color, texture, turgor normal. No rashes or lesions Lymph nodes: Cervical, supraclavicular, and axillary nodes normal. No abnormal inguinal nodes palpated.  Neurologic: Grossly normal  Pelvic: External genitalia:  no lesions              Urethra: +Urethra displaced slightly by mass effect              Bartholins and Skenes: +3 cm flesh-colored, bilobed, fluid-filled sac at 11 o'clock position, adjacent to R urethral meatus. Smooth,  well-circumscribed, non-tender, non-erythematous without drainage. Bartholin glands unremarkable bilaterally.               Vagina: normal appearing vagina with normal color and discharge, no lesions           Chaperone was present for exam  ASSESSMENT & PLAN   1. Vulvar lesion (Primary) 33 yo female patient presenting for 3 cm fluid-filled lesion adjacent to right urethral meatus that has grown over 6 months despite expectant/ conservative management attempts. Lesion is non-tender without local signs of infection, but is painful in some seated positions. Likely right skene's gland cyst, photos available in EMR. Referring to Dr. Jayne at [facility] for next steps in management. Patient states understanding and agreeable with plan.   2. Routine screening for STI (sexually transmitted infection) - Hepatitis B surface antigen - Hepatitis C antibody - Cervicovaginal ancillary only - HIV Antibody (routine testing w rflx) - RPR   An After Visit Summary was printed and given to the patient.  Merian Wroe E Mikia Delaluz, [place] 7/31/20259:55 AM

## 2024-01-16 NOTE — Progress Notes (Signed)
 Pt presents for evaluation of vaginal cyst. Pain with sitting. OSD February. Tried home treatments, cyst grew. Requests all STI testing.

## 2024-01-17 LAB — CERVICOVAGINAL ANCILLARY ONLY
Bacterial Vaginitis (gardnerella): NEGATIVE
Candida Glabrata: NEGATIVE
Candida Vaginitis: NEGATIVE
Chlamydia: NEGATIVE
Comment: NEGATIVE
Comment: NEGATIVE
Comment: NEGATIVE
Comment: NEGATIVE
Comment: NEGATIVE
Comment: NORMAL
Neisseria Gonorrhea: NEGATIVE
Trichomonas: NEGATIVE

## 2024-01-17 LAB — HEPATITIS B SURFACE ANTIGEN: Hepatitis B Surface Ag: NEGATIVE

## 2024-01-17 LAB — HEPATITIS C ANTIBODY: Hep C Virus Ab: NONREACTIVE

## 2024-01-17 LAB — RPR: RPR Ser Ql: NONREACTIVE

## 2024-01-17 LAB — HIV ANTIBODY (ROUTINE TESTING W REFLEX): HIV Screen 4th Generation wRfx: NONREACTIVE

## 2024-02-07 ENCOUNTER — Encounter: Payer: Self-pay | Admitting: Obstetrics & Gynecology

## 2024-02-07 ENCOUNTER — Ambulatory Visit: Admitting: Obstetrics & Gynecology

## 2024-02-07 VITALS — BP 102/69 | HR 82 | Ht 62.0 in | Wt 237.0 lb

## 2024-02-07 DIAGNOSIS — N907 Vulvar cyst: Secondary | ICD-10-CM

## 2024-02-07 NOTE — Progress Notes (Signed)
 No chief complaint on file.     33 y.o. G1P1001 Patient's last menstrual period was 01/14/2024 (exact date). The current method of family planning is none.  Outpatient Encounter Medications as of 02/07/2024  Medication Sig   amoxicillin (AMOXIL) 500 MG capsule Take 500 mg by mouth 3 (three) times daily.   ibuprofen (ADVIL) 800 MG tablet Take 800 mg by mouth every 8 (eight) hours as needed.   [DISCONTINUED] metroNIDAZOLE  (FLAGYL ) 500 MG tablet Take 1 tablet (500 mg total) by mouth 2 (two) times daily. (Patient not taking: Reported on 10/01/2023)   No facility-administered encounter medications on file as of 02/07/2024.    Subjective Referred from [facility] for right genitla cyst Present since February or so Has drained spontanoeusly since she saw [place]  On exam it is a vulvar cyst right labia minor, not infected Non tender Hangs off the lower crura of the clitoris  History reviewed. No pertinent past medical history.  Past Surgical History:  Procedure Laterality Date   APPENDECTOMY     COLON SURGERY     Partially removed    OB History     Gravida  1   Para  1   Term  1   Preterm      AB      Living  1      SAB      IAB      Ectopic      Multiple      Live Births  1           No Known Allergies  Social History   Socioeconomic History   Marital status: Single    Spouse name: Not on file   Number of children: Not on file   Years of education: Not on file   Highest education level: Not on file  Occupational History   Not on file  Tobacco Use   Smoking status: Former    Types: E-cigarettes   Smokeless tobacco: Never  Vaping Use   Vaping status: Never Used  Substance and Sexual Activity   Alcohol use: Yes    Comment: occassionally   Drug use: No   Sexual activity: Yes    Partners: Male    Birth control/protection: None  Other Topics Concern   Not on file  Social History Narrative   Not on file   Social Drivers of  Health   Financial Resource Strain: Not on file  Food Insecurity: Not on file  Transportation Needs: Not on file  Physical Activity: Not on file  Stress: Not on file  Social Connections: Not on file    Family History  Problem Relation Age of Onset   Diabetes Mother    Hypertension Mother     Medications:       Current Outpatient Medications:    amoxicillin (AMOXIL) 500 MG capsule, Take 500 mg by mouth 3 (three) times daily., Disp: , Rfl:    ibuprofen (ADVIL) 800 MG tablet, Take 800 mg by mouth every 8 (eight) hours as needed., Disp: , Rfl:   Objective Blood pressure 102/69, pulse 82, height 5' 2 (1.575 m), weight 237 lb (107.5 kg), last menstrual period 01/14/2024.  Right labia minora cyst, deflated currently  Pertinent ROS No burning with urination, frequency or urgency No nausea, vomiting or diarrhea Nor fever chills or other constitutional symptoms   Labs or studies All recent labs were normal    Impression + Management Plan: Diagnoses this  Encounter::   ICD-10-CM   1. 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   Labs or Scans Ordered during this encounter: No orders of the defined types were placed in this encounter.     Follow up Return in about 6 weeks (around 03/20/2024) for Post Op, with Dr Jayne.   Maurilio,   Please schedule Leretha Cree for surgery and contact her regarding pre op dates/times, etc::  Procedure:  Excision of right vulvar cyst (labia minora)  Diagnosis:  right vulvar cyst  Date or dates requested:  03/11/24 [facility]  Thanks,  Dr Jayne

## 2024-03-06 NOTE — Patient Instructions (Signed)
 Robin Kaufman  03/06/2024     @PREFPERIOPPHARMACY @   Your procedure is scheduled on  03/11/2024.   Report to [facility] at  [phone] A.M.   Call this number if you have problems the morning of surgery:  [phone]  If you experience any cold or flu symptoms such as cough, fever, chills, shortness of breath, etc. between now and your scheduled surgery, please notify us  at the above number.   Remember:  Do not eat after midnight.   You may drink clear liquids until  0650 am on 03/11/2024.     Clear liquids allowed are:                    Water, Juice (No red color; non-citric and without pulp; diabetics please choose diet or no sugar options), Carbonated beverages (diabetics please choose diet or no sugar options), Clear Tea (No creamer, milk, or cream, including half & half and powdered creamer), Black Coffee Only (No creamer, milk or cream, including half & half and powdered creamer), and Clear Sports drink (No red color; diabetics please choose diet or no sugar options)           At 0650 am on 03/11/2024 drink your carb drink. You can have nothing else after this.     Take these medicines the morning of surgery with A SIP OF WATER                                                     None.    Do not wear jewelry, make-up or nail polish, including gel polish,  artificial nails, or any other type of covering on natural nails (fingers and  toes).  Do not wear lotions, powders, or perfumes, or deodorant.  Do not shave 48 hours prior to surgery.  Men may shave face and neck.  Do not bring valuables to the hospital.  [facility] is not responsible for any belongings or valuables.  Contacts, dentures or bridgework may not be worn into surgery.  Leave your suitcase in the car.  After surgery it may be brought to your room.  For patients admitted to the hospital, discharge time will be determined by your treatment team.  Patients discharged the day of surgery will not be  allowed to drive home and must have someone with them for 24 hours.    Special instructions:   DO NOT smoke tobacco or vape for 24 hours before your procedure.  Please read over the following fact sheets that you were given. Coughing and Deep Breathing, Surgical Site Infection Prevention, Anesthesia Post-op Instructions, and Care and Recovery After Surgery      Incision and Drainage, Care After After incision and drainage, it is common to have: Pain or discomfort around the incision site. Blood, fluid, or pus (drainage) from the incision. Redness and firm skin around the incision site. Follow these instructions at home: Medicines Take over-the-counter and prescription medicines only as told by your health care provider. If you were prescribed antibiotics, take them as told by your provider. Do not stop using the antibiotic even if you start to feel better. Do not apply creams, ointments, or liquids unless you have been told to by your provider. Wound care Follow instructions from your provider about how to take care  of your wound. Make sure you: Wash your hands with soap and water for at least 20 seconds before and after you change your bandage (dressing). If soap and water are not available, use hand sanitizer. Change your dressing and any packing as told by your provider. If the dressing is dry or stuck when you try to remove it, moisten or wet it with saline or water. This will help you remove it without harming your skin or tissues. If your wound is packed, leave it in place until your provider tells you to remove it. To remove it, moisten or wet the packing with saline or water. Leave stitches (sutures), skin glue, or tape strips in place. These skin closures may need to stay in place for 2 weeks or longer. If tape strip edges start to loosen and curl up, you may trim the loose edges. Do not remove tape strips completely unless your provider tells you to do that. Check your wound  every day for signs of infection. Check for: More redness, swelling, or pain. More fluid or blood. Warmth. Pus or a bad smell. If you were sent home with a drain tube in place, follow instructions from your provider about: How to empty it. How to care for it at home. Be careful when you get rid of used dressings, wound packing, or drainage. Activity Rest the affected area. Return to your normal activities as told by your provider. Ask your provider what activities are safe for you. General instructions Do not use any products that contain nicotine or tobacco. These products include cigarettes, chewing tobacco, and vaping devices, such as e-cigarettes. These can delay incision healing after surgery. If you need help quitting, ask your provider. Do not take baths, swim, or use a hot tub until your provider approves. Ask your provider if you may take showers. You may only be allowed to take sponge baths. The incision will keep draining. It is normal to have some clear or slightly bloody drainage. The amount of drainage should go down each day. Keep all follow-up visits. Your provider will need to make sure that your incision is healing well and that there are no problems. Your health care provider may give you more instructions. Make sure you know what you can and cannot do Contact a health care provider if: Your cyst or abscess comes back. You have any signs of infection. You notice red streaks that spread away from the incision site. You have a fever or chills. Get help right away if: You have severe pain or bleeding. You become short of breath. You have chest pain. You have signs of a severe infection. You may notice changes in your incision area, such as: Swelling that makes the skin feel hard. Numbness or tingling. Sudden increase in redness. Your skin color may change from red to purple, and then to dark spots. Blisters, ulcers, or splitting of the skin. These symptoms may be an  emergency. Get help right away. Call 911. Do not wait to see if the symptoms will go away. Do not drive yourself to the hospital. This information is not intended to replace advice given to you by your health care provider. Make sure you discuss any questions you have with your health care provider. Document Revised: 01/22/2022 Document Reviewed: 01/22/2022 Elsevier Patient Education  2024 Elsevier Inc.General Anesthesia, Adult, Care After The following information offers guidance on how to care for yourself after your procedure. Your health care provider may also give you more specific  instructions. If you have problems or questions, contact your health care provider. What can I expect after the procedure? After the procedure, it is common for people to: Have pain or discomfort at the IV site. Have nausea or vomiting. Have a sore throat or hoarseness. Have trouble concentrating. Feel cold or chills. Feel weak, sleepy, or tired (fatigue). Have soreness and body aches. These can affect parts of the body that were not involved in surgery. Follow these instructions at home: For the time period you were told by your health care provider:  Rest. Do not participate in activities where you could fall or become injured. Do not drive or use machinery. Do not drink alcohol. Do not take sleeping pills or medicines that cause drowsiness. Do not make important decisions or sign legal documents. Do not take care of children on your own. General instructions Drink enough fluid to keep your urine pale yellow. If you have sleep apnea, surgery and certain medicines can increase your risk for breathing problems. Follow instructions from your health care provider about wearing your sleep device: Anytime you are sleeping, including during daytime naps. While taking prescription pain medicines, sleeping medicines, or medicines that make you drowsy. Return to your normal activities as told by your health  care provider. Ask your health care provider what activities are safe for you. Take over-the-counter and prescription medicines only as told by your health care provider. Do not use any products that contain nicotine or tobacco. These products include cigarettes, chewing tobacco, and vaping devices, such as e-cigarettes. These can delay incision healing after surgery. If you need help quitting, ask your health care provider. Contact a health care provider if: You have nausea or vomiting that does not get better with medicine. You vomit every time you eat or drink. You have pain that does not get better with medicine. You cannot urinate or have bloody urine. You develop a skin rash. You have a fever. Get help right away if: You have trouble breathing. You have chest pain. You vomit blood. These symptoms may be an emergency. Get help right away. Call 911. Do not wait to see if the symptoms will go away. Do not drive yourself to the hospital. Summary After the procedure, it is common to have a sore throat, hoarseness, nausea, vomiting, or to feel weak, sleepy, or fatigue. For the time period you were told by your health care provider, do not drive or use machinery. Get help right away if you have difficulty breathing, have chest pain, or vomit blood. These symptoms may be an emergency. This information is not intended to replace advice given to you by your health care provider. Make sure you discuss any questions you have with your health care provider. Document Revised: 09/01/2021 Document Reviewed: 09/01/2021 Elsevier Patient Education  2024 Elsevier Inc.How to Use Chlorhexidine at Home in the Shower Chlorhexidine gluconate (CHG) is a germ-killing (antiseptic) wash that's used to clean the skin. It can get rid of the germs that normally live on the skin and can keep them away for about 24 hours. If you're having surgery, you may be told to shower with CHG at home the night before surgery.  This can help lower your risk for infection. To use CHG wash in the shower, follow the steps below. Supplies needed: CHG body wash. Clean washcloth. Clean towel. How to use CHG in the shower Follow these steps unless you're told to use CHG in a different way: Start the shower. Use your normal  soap and shampoo to wash your face and hair. Turn off the shower or move out of the shower stream. Pour CHG onto a clean washcloth. Do not use any type of brush or rough sponge. Start at your neck, washing your body down to your toes. Make sure you: Wash the part of your body where the surgery will be done for at least 1 minute. Do not scrub. Do not use CHG on your head or face unless your health care provider tells you to. If it gets into your ears or eyes, rinse them well with water. Do not wash your genitals with CHG. Wash your back and under your arms. Make sure to wash skin folds. Let the CHG sit on your skin for 1-2 minutes or as long as told. Rinse your entire body in the shower, including all body creases and folds. Turn off the shower. Dry off with a clean towel. Do not put anything on your skin afterward, such as powder, lotion, or perfume. Put on clean clothes or pajamas. If it's the night before surgery, sleep in clean sheets. General tips Use CHG only as told, and follow the instructions on the label. Use the full amount of CHG as told. This is often one bottle. Do not smoke and stay away from flames after using CHG. Your skin may feel sticky after using CHG. This is normal. The sticky feeling will go away as the CHG dries. Do not use CHG: If you have a chlorhexidine allergy or have reacted to chlorhexidine in the past. On open wounds or areas of skin that have broken skin, cuts, or scrapes. On babies younger than 65 months of age. Contact a health care provider if: You have questions about using CHG. Your skin gets irritated or itchy. You have a rash after using CHG. You swallow  any CHG. Call your local poison control center [phone] in the [place] Your eyes itch badly, or they become very red or swollen. Your hearing changes. You have trouble seeing. If you can't reach your provider, go to an urgent care or emergency room. Do not drive yourself. Get help right away if: You have swelling or tingling in your mouth or throat. You make high-pitched whistling sounds when you breathe, most often when you breathe out (wheeze). You have trouble breathing. These symptoms may be an emergency. Call 911 right away. Do not wait to see if the symptoms will go away. Do not drive yourself to the hospital. This information is not intended to replace advice given to you by your health care provider. Make sure you discuss any questions you have with your health care provider. Document Revised: 12/18/2022 Document Reviewed: 12/14/2021 Elsevier Patient Education  2024 [employer].

## 2024-03-08 ENCOUNTER — Other Ambulatory Visit (HOSPITAL_COMMUNITY): Payer: Self-pay | Admitting: Obstetrics & Gynecology

## 2024-03-08 DIAGNOSIS — Z01818 Encounter for other preprocedural examination: Secondary | ICD-10-CM

## 2024-03-09 ENCOUNTER — Encounter (HOSPITAL_COMMUNITY)
Admission: RE | Admit: 2024-03-09 | Discharge: 2024-03-09 | Disposition: A | Discharge status: Home / Self Care | Source: Ambulatory Visit | Attending: Obstetrics & Gynecology | Admitting: Obstetrics & Gynecology

## 2024-03-09 ENCOUNTER — Encounter (HOSPITAL_COMMUNITY): Payer: Self-pay

## 2024-03-09 VITALS — BP 112/71 | HR 67 | Resp 18 | Ht 62.0 in | Wt 237.0 lb

## 2024-03-09 DIAGNOSIS — Z01810 Encounter for preprocedural cardiovascular examination: Secondary | ICD-10-CM | POA: Diagnosis present

## 2024-03-09 DIAGNOSIS — Z6841 Body Mass Index (BMI) 40.0 and over, adult: Secondary | ICD-10-CM | POA: Insufficient documentation

## 2024-03-09 DIAGNOSIS — Z01818 Encounter for other preprocedural examination: Secondary | ICD-10-CM | POA: Diagnosis not present

## 2024-03-09 DIAGNOSIS — Z01812 Encounter for preprocedural laboratory examination: Secondary | ICD-10-CM | POA: Diagnosis present

## 2024-03-09 LAB — COMPREHENSIVE METABOLIC PANEL WITH GFR
ALT: 18 U/L (ref 0–44)
AST: 18 U/L (ref 15–41)
Albumin: 3.9 g/dL (ref 3.5–5.0)
Alkaline Phosphatase: 52 U/L (ref 38–126)
Anion gap: 12 (ref 5–15)
BUN: 15 mg/dL (ref 6–20)
CO2: 23 mmol/L (ref 22–32)
Calcium: 8.9 mg/dL (ref 8.9–10.3)
Chloride: 105 mmol/L (ref 98–111)
Creatinine, Ser: 0.68 mg/dL (ref 0.44–1.00)
GFR, Estimated: >60 mL/min (ref >60)
Glucose, Bld: 98 mg/dL (ref 70–99)
Potassium: 3.9 mmol/L (ref 3.5–5.1)
Sodium: 140 mmol/L (ref 135–145)
Total Bilirubin: 0.5 mg/dL (ref 0.0–1.2)
Total Protein: 7.5 g/dL (ref 6.5–8.1)

## 2024-03-09 LAB — URINALYSIS, ROUTINE W REFLEX MICROSCOPIC
Bilirubin Urine: NEGATIVE
Glucose, UA: NEGATIVE mg/dL
Ketones, ur: NEGATIVE mg/dL
Leukocytes,Ua: NEGATIVE
Nitrite: NEGATIVE
Protein, ur: NEGATIVE mg/dL
Specific Gravity, Urine: 1.031 — ABNORMAL HIGH (ref 1.005–1.030)
pH: 5 (ref 5.0–8.0)

## 2024-03-09 LAB — CBC
HCT: 37.5 % (ref 36.0–46.0)
Hemoglobin: 12.5 g/dL (ref 12.0–15.0)
MCH: 28.5 pg (ref 26.0–34.0)
MCHC: 33.3 g/dL (ref 30.0–36.0)
MCV: 85.6 fL (ref 80.0–100.0)
Platelets: 300 K/uL (ref 150–400)
RBC: 4.38 MIL/uL (ref 3.87–5.11)
RDW: 14.2 % (ref 11.5–15.5)
WBC: 9.7 K/uL (ref 4.0–10.5)
nRBC: 0 % (ref 0.0–0.2)

## 2024-03-09 LAB — RAPID HIV SCREEN (HIV 1/2 AB+AG)
HIV 1/2 Antibodies: NONREACTIVE
HIV-1 P24 Antigen - HIV24: NONREACTIVE

## 2024-03-09 LAB — PREGNANCY, URINE: Preg Test, Ur: NEGATIVE

## 2024-03-10 NOTE — Progress Notes (Signed)
 Unable to reach patient by phone to let her know to arrive at 0730 tomorrow morning for her procedure.  Called alternate #, patients mom Robin Kaufman.  She confirms that she is aware patient is having a procedure and she can let the patient know to arrive at [facility] Wednesday morning.  Mother states patient is at work and is not allowed to have her phone.  Informed mother we could not leave a message on the patients number because it states your call can not be completed as dialed.  She voiced understanding and she would make sure the patient gets the message to arrive at [facility] Wednesday morning.

## 2024-03-11 ENCOUNTER — Ambulatory Visit (HOSPITAL_COMMUNITY): Admitting: Anesthesiology

## 2024-03-11 ENCOUNTER — Ambulatory Visit (HOSPITAL_COMMUNITY)
Admission: RE | Admit: 2024-03-11 | Discharge: 2024-03-11 | Disposition: A | Discharge status: Home / Self Care | Attending: Obstetrics & Gynecology | Admitting: Obstetrics & Gynecology

## 2024-03-11 ENCOUNTER — Other Ambulatory Visit: Payer: Self-pay

## 2024-03-11 ENCOUNTER — Encounter (HOSPITAL_COMMUNITY): Payer: Self-pay | Admitting: Obstetrics & Gynecology

## 2024-03-11 ENCOUNTER — Encounter (HOSPITAL_COMMUNITY)
Admission: RE | Disposition: A | Discharge status: Home / Self Care | Payer: Self-pay | Source: Home / Self Care | Attending: Obstetrics & Gynecology

## 2024-03-11 DIAGNOSIS — Z87891 Personal history of nicotine dependence: Secondary | ICD-10-CM | POA: Insufficient documentation

## 2024-03-11 DIAGNOSIS — E6689 Other obesity not elsewhere classified: Secondary | ICD-10-CM | POA: Diagnosis not present

## 2024-03-11 DIAGNOSIS — N907 Vulvar cyst: Secondary | ICD-10-CM

## 2024-03-11 DIAGNOSIS — Z6841 Body Mass Index (BMI) 40.0 and over, adult: Secondary | ICD-10-CM | POA: Diagnosis not present

## 2024-03-11 HISTORY — PX: CYST EXCISION PERINEAL: SHX6278

## 2024-03-11 SURGERY — EXCISION, CYST, PERINEUM
Anesthesia: General | Site: Vagina | Laterality: Right

## 2024-03-11 MED ORDER — PROPOFOL 10 MG/ML IV BOLUS
INTRAVENOUS | Status: DC | PRN
  Administered 2024-03-11: 180 mg via INTRAVENOUS

## 2024-03-11 MED ORDER — KETOROLAC TROMETHAMINE 10 MG PO TABS: 10.0000 mg | ORAL_TABLET | Freq: Three times a day (TID) | ORAL | 0 refills | Status: AC | PRN

## 2024-03-11 MED ORDER — 0.9 % SODIUM CHLORIDE (POUR BTL) OPTIME
TOPICAL | Status: DC | PRN
  Administered 2024-03-11: 1000 mL

## 2024-03-11 MED ORDER — HYDROCODONE-ACETAMINOPHEN 5-325 MG PO TABS: 1.0000 | ORAL_TABLET | Freq: Four times a day (QID) | ORAL | 0 refills | Status: AC | PRN

## 2024-03-11 MED ORDER — LACTATED RINGERS IV SOLN: INTRAVENOUS | Status: DC | PRN

## 2024-03-11 MED ORDER — SUGAMMADEX SODIUM 200 MG/2ML IV SOLN
INTRAVENOUS | Status: DC | PRN
  Administered 2024-03-11: 300 mg via INTRAVENOUS

## 2024-03-11 MED ORDER — ONDANSETRON HCL 4 MG/2ML IJ SOLN: 4.0000 mg | Freq: Once | INTRAMUSCULAR | Status: DC | PRN

## 2024-03-11 MED ORDER — ROCURONIUM 10MG/ML (10ML) SYRINGE FOR MEDFUSION PUMP - OPTIME: INTRAVENOUS | Status: DC | PRN

## 2024-03-11 MED ORDER — KETOROLAC TROMETHAMINE 30 MG/ML IJ SOLN
INTRAMUSCULAR | Status: AC
  Filled 2024-03-11: qty 1

## 2024-03-11 MED ORDER — OXYCODONE HCL 5 MG PO TABS: 5.0000 mg | ORAL_TABLET | Freq: Once | ORAL | Status: DC | PRN

## 2024-03-11 MED ORDER — MIDAZOLAM HCL 5 MG/5ML IJ SOLN
INTRAMUSCULAR | Status: DC | PRN
  Administered 2024-03-11: 2 mg via INTRAVENOUS

## 2024-03-11 MED ORDER — BUPIVACAINE LIPOSOME 1.3 % IJ SUSP
INTRAMUSCULAR | Status: DC | PRN
  Administered 2024-03-11: 20 mL

## 2024-03-11 MED ORDER — CEFAZOLIN SODIUM-DEXTROSE 2-4 GM/100ML-% IV SOLN
2.0000 g | INTRAVENOUS | Status: AC
  Administered 2024-03-11: 2 g via INTRAVENOUS

## 2024-03-11 MED ORDER — DEXAMETHASONE SODIUM PHOSPHATE 10 MG/ML IJ SOLN
INTRAMUSCULAR | Status: AC
  Filled 2024-03-11: qty 1

## 2024-03-11 MED ORDER — FENTANYL CITRATE (PF) 100 MCG/2ML IJ SOLN
INTRAMUSCULAR | Status: DC | PRN
  Administered 2024-03-11: 75 ug via INTRAVENOUS
  Administered 2024-03-11: 25 ug via INTRAVENOUS

## 2024-03-11 MED ORDER — POVIDONE-IODINE 10 % EX SWAB
2.0000 | Freq: Once | CUTANEOUS | Status: AC
Start: 2024-03-11 — End: 2024-03-11
  Administered 2024-03-11: 2 via TOPICAL

## 2024-03-11 MED ORDER — LIDOCAINE 2% (20 MG/ML) 5 ML SYRINGE
INTRAMUSCULAR | Status: AC
  Filled 2024-03-11: qty 5

## 2024-03-11 MED ORDER — PROPOFOL 500 MG/50ML IV EMUL
INTRAVENOUS | Status: AC
  Filled 2024-03-11: qty 50

## 2024-03-11 MED ORDER — FENTANYL CITRATE PF 50 MCG/ML IJ SOSY: 25.0000 ug | PREFILLED_SYRINGE | INTRAMUSCULAR | Status: DC | PRN

## 2024-03-11 MED ORDER — ONDANSETRON HCL 4 MG/2ML IJ SOLN
INTRAMUSCULAR | Status: DC | PRN
  Administered 2024-03-11: 4 mg via INTRAVENOUS

## 2024-03-11 MED ORDER — LIDOCAINE 2% (20 MG/ML) 5 ML SYRINGE
INTRAMUSCULAR | Status: DC | PRN
  Administered 2024-03-11: 60 mg via INTRAVENOUS

## 2024-03-11 MED ORDER — DEXAMETHASONE SODIUM PHOSPHATE 10 MG/ML IJ SOLN
INTRAMUSCULAR | Status: DC | PRN
Start: 2024-03-11 — End: 2024-03-11
  Administered 2024-03-11: 5 mg via INTRAVENOUS

## 2024-03-11 MED ORDER — ONDANSETRON 8 MG PO TBDP: 8.0000 mg | ORAL_TABLET | Freq: Three times a day (TID) | ORAL | 0 refills | Status: AC | PRN

## 2024-03-11 MED ORDER — ROCURONIUM BROMIDE 10 MG/ML (PF) SYRINGE
PREFILLED_SYRINGE | INTRAVENOUS | Status: DC | PRN
  Administered 2024-03-11: 50 mg via INTRAVENOUS

## 2024-03-11 MED ORDER — KETOROLAC TROMETHAMINE 30 MG/ML IJ SOLN
30.0000 mg | INTRAMUSCULAR | Status: AC
  Administered 2024-03-11: 30 mg via INTRAVENOUS

## 2024-03-11 MED ORDER — CEFAZOLIN SODIUM-DEXTROSE 2-4 GM/100ML-% IV SOLN
INTRAVENOUS | Status: AC
  Filled 2024-03-11: qty 100

## 2024-03-11 MED ORDER — ONDANSETRON HCL 4 MG/2ML IJ SOLN
INTRAMUSCULAR | Status: AC
  Filled 2024-03-11: qty 2

## 2024-03-11 MED ORDER — FENTANYL CITRATE (PF) 100 MCG/2ML IJ SOLN
INTRAMUSCULAR | Status: AC
  Filled 2024-03-11: qty 2

## 2024-03-11 MED ORDER — MIDAZOLAM HCL 2 MG/2ML IJ SOLN
INTRAMUSCULAR | Status: AC
  Filled 2024-03-11: qty 2

## 2024-03-11 MED ORDER — BUPIVACAINE LIPOSOME 1.3 % IJ SUSP
INTRAMUSCULAR | Status: AC
  Filled 2024-03-11: qty 20

## 2024-03-11 MED ORDER — ROCURONIUM BROMIDE 10 MG/ML (PF) SYRINGE
PREFILLED_SYRINGE | INTRAVENOUS | Status: AC
  Filled 2024-03-11: qty 10

## 2024-03-11 MED ORDER — OXYCODONE HCL 5 MG/5ML PO SOLN: 5.0000 mg | Freq: Once | ORAL | Status: DC | PRN

## 2024-03-11 SURGICAL SUPPLY — 24 items
BAG HAMPER (MISCELLANEOUS) ×1 IMPLANT
CLOTH BEACON ORANGE TIMEOUT ST (SAFETY) ×1 IMPLANT
COVER LIGHT HANDLE STERIS (MISCELLANEOUS) ×2 IMPLANT
DRAPE HALF SHEET 40X57 (DRAPES) ×1 IMPLANT
ELECTRODE REM PT RTRN 9FT ADLT (ELECTROSURGICAL) ×1 IMPLANT
GAUZE 4X4 16PLY ~~LOC~~+RFID DBL (SPONGE) ×1 IMPLANT
GAUZE SPONGE 4X4 12PLY STRL (GAUZE/BANDAGES/DRESSINGS) IMPLANT
GLOVE BIOGEL PI IND STRL 7.0 (GLOVE) ×2 IMPLANT
GLOVE BIOGEL PI IND STRL 8 (GLOVE) ×1 IMPLANT
GLOVE ECLIPSE 8.0 STRL XLNG CF (GLOVE) ×2 IMPLANT
GOWN STRL REUS W/TWL LRG LVL3 (GOWN DISPOSABLE) ×1 IMPLANT
GOWN STRL REUS W/TWL XL LVL3 (GOWN DISPOSABLE) ×1 IMPLANT
KIT TURNOVER CYSTO (KITS) ×1 IMPLANT
MANIFOLD NEPTUNE II (INSTRUMENTS) ×1 IMPLANT
NDL HYPO 18GX1.5 BLUNT FILL (NEEDLE) ×1 IMPLANT
NEEDLE HYPO 18GX1.5 BLUNT FILL (NEEDLE) ×1 IMPLANT
NS IRRIG 1000ML POUR BTL (IV SOLUTION) ×1 IMPLANT
PACK PERI GYN (CUSTOM PROCEDURE TRAY) ×1 IMPLANT
PAD ARMBOARD POSITIONER FOAM (MISCELLANEOUS) ×1 IMPLANT
POSITIONER HEAD 8X9X4 ADT (SOFTGOODS) ×1 IMPLANT
SET BASIN LINEN APH (SET/KITS/TRAYS/PACK) ×1 IMPLANT
SUT MNCRL+ AB 3-0 CT1 36 (SUTURE) IMPLANT
SUT MON AB 3-0 SH 27 (SUTURE) IMPLANT
SYR 20ML LL LF (SYRINGE) ×2 IMPLANT

## 2024-03-11 NOTE — Anesthesia Preprocedure Evaluation (Addendum)
 Anesthesia Evaluation  Patient identified by MRN, date of birth, ID band Patient awake    Reviewed: Allergy & Precautions, H&P , NPO status , Patient's Chart, lab work & pertinent test results, reviewed documented beta blocker date and time   Airway Mallampati: II  TM Distance: >3 FB Neck ROM: full    Dental no notable dental hx.    Pulmonary former smoker   Pulmonary exam normal breath sounds clear to auscultation       Cardiovascular Exercise Tolerance: Good hypertension, negative cardio ROS  Rhythm:regular Rate:Normal     Neuro/Psych negative neurological ROS  negative psych ROS   GI/Hepatic negative GI ROS, Neg liver ROS,,,  Endo/Other    Class 4 obesity  Renal/GU negative Renal ROS  negative genitourinary   Musculoskeletal   Abdominal   Peds  Hematology negative hematology ROS (+)   Anesthesia Other Findings   Reproductive/Obstetrics negative OB ROS                              Anesthesia Physical Anesthesia Plan  ASA: 2  Anesthesia Plan: General and General ETT   Post-op Pain Management:    Induction:   PONV Risk Score and Plan: Ondansetron   Airway Management Planned:   Additional Equipment:   Intra-op Plan:   Post-operative Plan:   Informed Consent: I have reviewed the patients History and Physical, chart, labs and discussed the procedure including the risks, benefits and alternatives for the proposed anesthesia with the patient or authorized representative who has indicated his/her understanding and acceptance.     Dental Advisory Given  Plan Discussed with: CRNA  Anesthesia Plan Comments:          Anesthesia Quick Evaluation

## 2024-03-11 NOTE — Discharge Instructions (Addendum)
 Information for Discharge Teaching: EXPAREL  (bupivacaine  liposome injectable suspension)   Pain relief is important to your recovery. The goal is to control your pain so you can move easier and return to your normal activities as soon as possible after your procedure. Your physician may use several types of medicines to manage pain, swelling, and more.  Your surgeon or anesthesiologist gave you EXPAREL (bupivacaine ) to help control your pain after surgery.  EXPAREL  is a local anesthetic designed to release slowly over an extended period of time to provide pain relief by numbing the tissue around the surgical site. EXPAREL  is designed to release pain medication over time and can control pain for up to 72 hours. Depending on how you respond to EXPAREL , you may require less pain medication during your recovery. EXPAREL  can help reduce or eliminate the need for opioids during the first few days after surgery when pain relief is needed the most. EXPAREL  is not an opioid and is not addictive. It does not cause sleepiness or sedation.   Important! A teal colored band has been placed on your arm with the date, time and amount of EXPAREL  you have received. Please leave this armband in place for the full 96 hours following administration, and then you may remove the band. If you return to the hospital for any reason within 96 hours following the administration of EXPAREL , the armband provides important information that your health care providers to know, and alerts them that you have received this anesthetic.    Possible side effects of EXPAREL : Temporary loss of sensation or ability to move in the area where medication was injected. Nausea, vomiting, constipation Rarely, numbness and tingling in your mouth or lips, lightheadedness, or anxiety may occur. Call your doctor right away if you think you may be experiencing any of these sensations, or if you have other questions regarding possible side  effects.  Follow all other discharge instructions given to you by your surgeon or nurse. Eat a healthy diet and drink plenty of water or other fluids.   Post Anesthesia Home Care Instructions  Activity: Get plenty of rest for the remainder of the day. A responsible individual must stay with you for 24 hours following the procedure.  For the next 24 hours, DO NOT: -Drive a car -Advertising copywriter -Drink alcoholic beverages -Take any medication unless instructed by your physician -Make any legal decisions or sign important papers.  Meals: Start with liquid foods such as gelatin or soup. Progress to regular foods as tolerated. Avoid greasy, spicy, heavy foods. If nausea and/or vomiting occur, drink only clear liquids until the nausea and/or vomiting subsides. Call your physician if vomiting continues.  Special Instructions/Symptoms: Your throat may feel dry or sore from the anesthesia or the breathing tube placed in your throat during surgery. If this causes discomfort, gargle with warm salt water. The discomfort should disappear within 24 hours.

## 2024-03-11 NOTE — Progress Notes (Signed)
 Patient is on her period. Her perineum was cleansed and clean peripad placed.

## 2024-03-11 NOTE — Progress Notes (Signed)
 No drainage from surgical area patient only drainage seen is from menses.

## 2024-03-11 NOTE — Transfer of Care (Signed)
 Immediate Anesthesia Transfer of Care Note  Patient: Robin Kaufman  Procedure(s) Performed: EXCISION OF RIGHT VULVAR CYST (Right: Vagina )  Patient Location: PACU  Anesthesia Type:General  Level of Consciousness: awake  Airway & Oxygen Therapy: Patient Spontanous Breathing and Patient connected to face mask oxygen  Post-op Assessment: Report given to RN and Post -op Vital signs reviewed and stable  Post vital signs: Reviewed and stable  Last Vitals:  Vitals Value Taken Time  BP 126/80   Temp 98.3   Pulse 85   Resp 14   SpO2 100%     Last Pain:  Vitals:   03/11/24 0811  TempSrc:   PainSc: 0-No pain      Patients Stated Pain Goal: 4 (03/11/24 0800)  Complications: No notable events documented.

## 2024-03-11 NOTE — H&P (Signed)
 Preoperative History and Physical  Robin Kaufman is a 33 y.o. G1P1001 with Patient's last menstrual period was 02/11/2024. admitted for a excision of right vulvar cyst, spontaneously drained was about 4 cm.   Referred from [facility] for right genitla cyst Present since February or so Has drained spontanoeusly since she saw [place]   On exam it is a vulvar cyst right labia minor, not infected Non tender Hangs off the lower crura of the clitoris  PMH:   History reviewed. No pertinent past medical history.  PSH:     Past Surgical History:  Procedure Laterality Date   APPENDECTOMY     COLON SURGERY     Partially removed    POb/GynH:      OB History     Gravida  1   Para  1   Term  1   Preterm      AB      Living  1      SAB      IAB      Ectopic      Multiple      Live Births  1           SH:   Social History   Tobacco Use   Smoking status: Former    Types: E-cigarettes   Smokeless tobacco: Never  Vaping Use   Vaping status: Never Used  Substance Use Topics   Alcohol use: Yes    Comment: occassionally   Drug use: No    FH:    Family History  Problem Relation Age of Onset   Diabetes Mother    Hypertension Mother      Allergies: No Known Allergies  Medications:       Current Facility-Administered Medications:    ceFAZolin  (ANCEF ) 2-4 GM/100ML-% IVPB, , , ,    ceFAZolin  (ANCEF ) IVPB 2g/100 mL premix, 2 g, Intravenous, On Call to OR, Jayne Vonn DEL, MD   ketorolac  (TORADOL ) 30 MG/ML injection, , , ,   Review of Systems:   Review of Systems  Constitutional: Negative for fever, chills, weight loss, malaise/fatigue and diaphoresis.  HENT: Negative for hearing loss, ear pain, nosebleeds, congestion, sore throat, neck pain, tinnitus and ear discharge.   Eyes: Negative for blurred vision, double vision, photophobia, pain, discharge and redness.  Respiratory: Negative for cough, hemoptysis, sputum production, shortness of breath,  wheezing and stridor.   Cardiovascular: Negative for chest pain, palpitations, orthopnea, claudication, leg swelling and PND.  Gastrointestinal: Positive for abdominal pain. Negative for heartburn, nausea, vomiting, diarrhea, constipation, blood in stool and melena.  Genitourinary: Negative for dysuria, urgency, frequency, hematuria and flank pain.  Musculoskeletal: Negative for myalgias, back pain, joint pain and falls.  Skin: Negative for itching and rash.  Neurological: Negative for dizziness, tingling, tremors, sensory change, speech change, focal weakness, seizures, loss of consciousness, weakness and headaches.  Endo/Heme/Allergies: Negative for environmental allergies and polydipsia. Does not bruise/bleed easily.  Psychiatric/Behavioral: Negative for depression, suicidal ideas, hallucinations, memory loss and substance abuse. The patient is not nervous/anxious and does not have insomnia.      PHYSICAL EXAM:  Blood pressure 125/70, pulse 71, temperature 98.2 F (36.8 C), temperature source Oral, resp. rate (!) 21, height 5' 2 (1.575 m), weight 107.5 kg, last menstrual period 02/11/2024, SpO2 100%.    Vitals reviewed. Constitutional: She is oriented to person, place, and time. She appears well-developed and well-nourished.  HENT:  Head: Normocephalic and atraumatic.  Right Ear: External ear normal.  Left Ear: External ear normal.  Nose: Nose normal.  Mouth/Throat: Oropharynx is clear and moist.  Eyes: Conjunctivae and EOM are normal. Pupils are equal, round, and reactive to light. Right eye exhibits no discharge. Left eye exhibits no discharge. No scleral icterus.  Neck: Normal range of motion. Neck supple. No tracheal deviation present. No thyromegaly present.  Cardiovascular: Normal rate, regular rhythm, normal heart sounds and intact distal pulses.  Exam reveals no gallop and no friction rub.   No murmur heard. Respiratory: Effort normal and breath sounds normal. No respiratory  distress. She has no wheezes. She has no rales. She exhibits no tenderness.  GI: Soft. Bowel sounds are normal. She exhibits no distension and no mass. There is tenderness. There is no rebound and no guarding.  Genitourinary:       Vulva is significant for this 4 cm vulvar cyst, has drained previously Vagina is pink moist without discharge Cervix normal in appearance and pap is normal Uterus is normal size, contour, position, consistency, mobility, non-tender Adnexa is negative with normal sized ovaries by sonogram  Musculoskeletal: Normal range of motion. She exhibits no edema and no tenderness.  Neurological: She is alert and oriented to person, place, and time. She has normal reflexes. She displays normal reflexes. No cranial nerve deficit. She exhibits normal muscle tone. Coordination normal.  Skin: Skin is warm and dry. No rash noted. No erythema. No pallor.  Psychiatric: She has a normal mood and affect. Her behavior is normal. Judgment and thought content normal.    Labs: Results for orders placed or performed during the hospital encounter of 03/09/24 (from the past 2 weeks)  CBC   Collection Time: 03/09/24 10:13 AM  Result Value Ref Range   WBC 9.7 4.0 - 10.5 K/uL   RBC 4.38 3.87 - 5.11 MIL/uL   Hemoglobin 12.5 12.0 - 15.0 g/dL   HCT 62.4 63.9 - 53.9 %   MCV 85.6 80.0 - 100.0 fL   MCH 28.5 26.0 - 34.0 pg   MCHC 33.3 30.0 - 36.0 g/dL   RDW 85.7 88.4 - 84.4 %   Platelets 300 150 - 400 K/uL   nRBC 0.0 0.0 - 0.2 %  Comprehensive metabolic panel   Collection Time: 03/09/24 10:13 AM  Result Value Ref Range   Sodium 140 135 - 145 mmol/L   Potassium 3.9 3.5 - 5.1 mmol/L   Chloride 105 98 - 111 mmol/L   CO2 23 22 - 32 mmol/L   Glucose, Bld 98 70 - 99 mg/dL   BUN 15 6 - 20 mg/dL   Creatinine, Ser 9.31 0.44 - 1.00 mg/dL   Calcium 8.9 8.9 - 89.6 mg/dL   Total Protein 7.5 6.5 - 8.1 g/dL   Albumin 3.9 3.5 - 5.0 g/dL   AST 18 15 - 41 U/L   ALT 18 0 - 44 U/L   Alkaline Phosphatase  52 38 - 126 U/L   Total Bilirubin 0.5 0.0 - 1.2 mg/dL   GFR, Estimated >39 >39 mL/min   Anion gap 12 5 - 15  Rapid HIV screen (HIV 1/2 Ab+Ag)   Collection Time: 03/09/24 10:13 AM  Result Value Ref Range   HIV-1 P24 Antigen - HIV24 NON REACTIVE NON REACTIVE   HIV 1/2 Antibodies NON REACTIVE NON REACTIVE   Interpretation (HIV Ag Ab)      A non reactive test result means that HIV 1 or HIV 2 antibodies and HIV 1 p24 antigen were not detected in the specimen.  Pregnancy, urine   Collection Time: 03/09/24 10:13 AM  Result Value Ref Range   Preg Test, Ur NEGATIVE NEGATIVE  Urinalysis, Routine w reflex microscopic -Urine, Clean Catch   Collection Time: 03/09/24 10:13 AM  Result Value Ref Range   Color, Urine YELLOW YELLOW   APPearance HAZY (A) CLEAR   Specific Gravity, Urine 1.031 (H) 1.005 - 1.030   pH 5.0 5.0 - 8.0   Glucose, UA NEGATIVE NEGATIVE mg/dL   Hgb urine dipstick SMALL (A) NEGATIVE   Bilirubin Urine NEGATIVE NEGATIVE   Ketones, ur NEGATIVE NEGATIVE mg/dL   Protein, ur NEGATIVE NEGATIVE mg/dL   Nitrite NEGATIVE NEGATIVE   Leukocytes,Ua NEGATIVE NEGATIVE   RBC / HPF 6-10 0 - 5 RBC/hpf   WBC, UA 0-5 0 - 5 WBC/hpf   Bacteria, UA RARE (A) NONE SEEN   Squamous Epithelial / HPF 0-5 0 - 5 /HPF   Mucus PRESENT     EKG: Orders placed or performed during the hospital encounter of 03/09/24   EKG 12-LEAD   EKG 12-LEAD    Imaging Studies: No results found.    Assessment: Right vulvar cyst   Plan: Excision of vulvar cystic mass  Vonn VEAR Inch 03/11/2024 11:53 AM

## 2024-03-11 NOTE — Anesthesia Procedure Notes (Addendum)
 Procedure Name: Intubation Date/Time: 03/11/2024 12:10 PM  Performed by: Toribio Darice BRAVO, CRNAPre-anesthesia Checklist: Patient identified, Patient being monitored, Timeout performed, Emergency Drugs available and Suction available Patient Re-evaluated:Patient Re-evaluated prior to induction Oxygen Delivery Method: Circle System Utilized Preoxygenation: Pre-oxygenation with 100% oxygen Induction Type: IV induction Ventilation: Mask ventilation without difficulty Laryngoscope Size: Mac and 3 Grade View: Grade II Tube type: Oral Tube size: 7.0 mm Number of attempts: 1 Airway Equipment and Method: stylet Placement Confirmation: ETT inserted through vocal cords under direct vision, positive ETCO2 and breath sounds checked- equal and bilateral Secured at: 21 cm Tube secured with: Tape Dental Injury: Teeth and Oropharynx as per pre-operative assessment

## 2024-03-11 NOTE — Op Note (Signed)
 Preop diagnosis: Right vulvar cyst (Specifically a cyst which was originating from the right crura of the clitoris) (This is the picture that is in the chart dated 01/16/2024 by Jorene Moats, PA-C. Subsequently prior to seeing me on 02/07/2024 it spontaneously drained and it was fully drained at the time of surgery today as well)  Postoperative diagnosis: Same as above  Procedure: Excision of right vulvar cyst, specifically originating inferiorly at the right clitoral crura  Surgeon: Vonn VEAR Inch, MD  Anesthesia: Laryngeal mask airway  Findings: The cyst was completely deflated with no fluid in it and it was as I thought I am February 07, 2024 in the office Was not infected at that time and neither is it today You can see the cyst wall and sac and extends all the way up to the medial portion of the right clitoral crura just inferior to the clitoris itself There were no other abnormal findings  Description of operation: Patient was taken to the operating room where she was placed in the supine position She underwent laryngeal mask airway anesthesia She was placed in the high lithotomy position with candycane Sterets She was prepped and draped for a vulvar procedure including the vagina  I took some time to delineate the margins of the cyst course as it was abutting the clitoris When I determine the cyst wall border I used Metzenbaum scissors and started at the superior aspect and using an Allis clamp to provide traction I made an incision and divided the 2 layers of the very thin labia at this area just at the medial aspect of the right clitoral crura I then used a moist Ray-Tec and did blunt dissection of the tissue away from the cyst wall and then was able to use a Metzenbaum scissors to remove the cyst in toto as well as the overlying vulva of this area. I did this when I was free of the clitoris and crura  I then used multiple 3-0 Monocryl  interrupted and figure-of-eight sutures  for hemostasis and to close the wound I grabbed the labia minora medially and then grabbed deeper tissue and then the superficial labia on the right to eliminate dead space and to improved hemostasis There was good anatomical and hemostatic control at the end of the procedure  I held pressure for couple minutes just observed and there was no further bleeding  Then injected 20 cc of bupivacaine  around the area of surgery not directly in the surgical site provide postoperative pain but also not to cause any swelling of the surgical site itself  The patient was awakened from anesthesia taken recovery in good stable condition all counts correct x 3  Received 2 g of Ancef  and 30 mg of Toradol  IV preoperatively prophylactically  Discharge from the PACU see him back in the office in 9 days for postop visit  Vonn VEAR Inch, MD 03/11/2024 6:04 PM

## 2024-03-12 ENCOUNTER — Encounter (HOSPITAL_COMMUNITY): Payer: Self-pay | Admitting: Obstetrics & Gynecology

## 2024-03-12 ENCOUNTER — Telehealth: Payer: Self-pay | Admitting: Obstetrics & Gynecology

## 2024-03-12 NOTE — Telephone Encounter (Signed)
 Patient calling stating that she needs a letter that she had surgery yesterday and how long she needs to be out of work for her job.

## 2024-03-12 NOTE — Anesthesia Postprocedure Evaluation (Signed)
 Anesthesia Post Note  Patient: Engineer, agricultural  Procedure(s) Performed: EXCISION OF RIGHT VULVAR CYST (Right: Vagina )  Patient location during evaluation: Phase II Anesthesia Type: General Level of consciousness: awake Pain management: pain level controlled Vital Signs Assessment: post-procedure vital signs reviewed and stable Respiratory status: spontaneous breathing and respiratory function stable Cardiovascular status: blood pressure returned to baseline and stable Postop Assessment: no headache and no apparent nausea or vomiting Anesthetic complications: no Comments: Late entry   No notable events documented.   Last Vitals:  Vitals:   03/11/24 1330 03/11/24 1340  BP: 108/69 116/74  Pulse: 75 63  Resp: 16 17  Temp:  36.8 C  SpO2: 99% 100%    Last Pain:  Vitals:   03/11/24 1340  TempSrc:   PainSc: 0-No pain                 Yvonna JINNY Bosworth

## 2024-03-13 LAB — SURGICAL PATHOLOGY

## 2024-03-20 ENCOUNTER — Encounter: Payer: Self-pay | Admitting: Obstetrics & Gynecology

## 2024-03-20 ENCOUNTER — Ambulatory Visit (INDEPENDENT_AMBULATORY_CARE_PROVIDER_SITE_OTHER): Admitting: Obstetrics & Gynecology

## 2024-03-20 VITALS — BP 95/61 | HR 65 | Ht 62.0 in | Wt 237.0 lb

## 2024-03-20 DIAGNOSIS — Z9889 Other specified postprocedural states: Secondary | ICD-10-CM

## 2024-03-20 MED ORDER — SILVER SULFADIAZINE 1 % EX CREA: TOPICAL_CREAM | CUTANEOUS | 11 refills | Status: AC

## 2024-03-20 NOTE — Progress Notes (Signed)
  HPI: Patient returns for routine postoperative follow-up having undergone excision of right vulvar cyst on March 11, 2024.  The patient's immediate postoperative recovery has been unremarkable. Since hospital discharge the patient reports normal postop issues no problems.   Current Outpatient Medications: HYDROcodone -acetaminophen  (NORCO/VICODIN) 5-325 MG tablet, Take 1 tablet by mouth every 6 (six) hours as needed., Disp: 15 tablet, Rfl: 0 ketorolac  (TORADOL ) 10 MG tablet, Take 1 tablet (10 mg total) by mouth every 8 (eight) hours as needed., Disp: 15 tablet, Rfl: 0 ondansetron  (ZOFRAN -ODT) 8 MG disintegrating tablet, Take 1 tablet (8 mg total) by mouth every 8 (eight) hours as needed for nausea or vomiting. (Patient not taking: Reported on 03/20/2024), Disp: 8 tablet, Rfl: 0  No current facility-administered medications for this visit.    Blood pressure 95/61, pulse 65, height 5' 2 (1.575 m), weight 237 lb (107.5 kg), last menstrual period 02/11/2024.  Physical Exam: Surgical site is healing well No evidence of infection Normal postop progression  Diagnostic Tests:   Pathology: Benign vulvar cyst no atypia or dysplasia or malignancy  Impression + Management plan:   ICD-10-CM   1. Post-operative state: Excision of a vulvar cyst appearing to originate from the inferior margin of the right clitoral crura  Z98.890    Seen in today for postop visit, no specific complaints, exam normal, healing well, will begin topical Silvadene cream twice daily          Medications Prescribed this encounter: Meds ordered this encounter  Medications   silver sulfADIAZINE (SILVADENE) 1 % cream    Sig: Apply twice daily    Dispense:  50 g    Refill:  11        Follow up: Return in about 1 month (around 04/20/2024) for Post Op, with Dr Jayne.    Vonn VEAR Jayne, MD Attending Physician for the Center for [facility] 03/20/2024 10:20 AM

## 2024-04-27 ENCOUNTER — Ambulatory Visit: Admitting: Obstetrics & Gynecology

## 2024-04-27 ENCOUNTER — Encounter: Payer: Self-pay | Admitting: Obstetrics & Gynecology

## 2024-04-27 VITALS — BP 116/76 | HR 74 | Ht 62.0 in | Wt 228.0 lb

## 2024-04-27 DIAGNOSIS — Z9889 Other specified postprocedural states: Secondary | ICD-10-CM

## 2024-04-27 NOTE — Progress Notes (Signed)
  HPI: Patient returns for routine postoperative follow-up having undergone     ICD-10-CM   1. Post-operative state: Excision of a vulvar cyst appearing to originate from the inferior margin of the right clitoral crura  Z98.890    healed well ok for sex etc       The patient's immediate postoperative recovery has been unremarkable. Since hospital discharge the patient reports no problems.   Current Outpatient Medications: silver sulfADIAZINE (SILVADENE) 1 % cream, Apply twice daily, Disp: 50 g, Rfl: 11 HYDROcodone -acetaminophen  (NORCO/VICODIN) 5-325 MG tablet, Take 1 tablet by mouth every 6 (six) hours as needed. (Patient not taking: Reported on 04/27/2024), Disp: 15 tablet, Rfl: 0 ketorolac  (TORADOL ) 10 MG tablet, Take 1 tablet (10 mg total) by mouth every 8 (eight) hours as needed. (Patient not taking: Reported on 04/27/2024), Disp: 15 tablet, Rfl: 0 ondansetron  (ZOFRAN -ODT) 8 MG disintegrating tablet, Take 1 tablet (8 mg total) by mouth every 8 (eight) hours as needed for nausea or vomiting. (Patient not taking: Reported on 04/27/2024), Disp: 8 tablet, Rfl: 0  No current facility-administered medications for this visit.    Blood pressure 116/76, pulse 74, height 5' 2 (1.575 m), weight 228 lb (103.4 kg).  Physical Exam: Surgical site has healed up great, some labia minor asymmetry Non tneder, clitoral integrity has been maintained   Diagnostic Tests:   Pathology: Benign vulvar cyst  Impression + Management plan:   ICD-10-CM   1. Post-operative state: Excision of a vulvar cyst appearing to originate from the inferior margin of the right clitoral crura  Z98.890    healed well ok for sex etc          Medications Prescribed this encounter: No orders of the defined types were placed in this encounter.     Follow up: Return if symptoms worsen or fail to improve, for 10/2024 _>pap + yearly.    Vonn VEAR Inch, MD Attending Physician for the Center for [facility] and [facility] 04/27/2024 9:30 AM
